# Patient Record
Sex: Female | Born: 1950 | Race: Black or African American | Hispanic: No | Marital: Married | Smoking: Never smoker
Health system: Southern US, Community
[De-identification: ages and names within clinical notes are randomized; demographics above are authoritative.]

## PROBLEM LIST (undated history)

## (undated) DIAGNOSIS — M5430 Sciatica, unspecified side: Secondary | ICD-10-CM

## (undated) DIAGNOSIS — M1990 Unspecified osteoarthritis, unspecified site: Secondary | ICD-10-CM

## (undated) HISTORY — PX: COLONOSCOPY W/ POLYPECTOMY: SHX1380

## (undated) HISTORY — PX: CHOLECYSTECTOMY: SHX55

## (undated) HISTORY — PX: APPENDECTOMY: SHX54

---

## 1998-09-08 ENCOUNTER — Other Ambulatory Visit: Admission: RE | Admit: 1998-09-08 | Discharge: 1998-09-08 | Payer: Self-pay | Admitting: Obstetrics & Gynecology

## 1999-10-24 ENCOUNTER — Other Ambulatory Visit: Admission: RE | Admit: 1999-10-24 | Discharge: 1999-10-24 | Payer: Self-pay | Admitting: Obstetrics & Gynecology

## 2000-10-23 ENCOUNTER — Other Ambulatory Visit: Admission: RE | Admit: 2000-10-23 | Discharge: 2000-10-23 | Payer: Self-pay | Admitting: Obstetrics & Gynecology

## 2001-11-03 ENCOUNTER — Other Ambulatory Visit: Admission: RE | Admit: 2001-11-03 | Discharge: 2001-11-03 | Payer: Self-pay | Admitting: Obstetrics & Gynecology

## 2002-11-05 ENCOUNTER — Other Ambulatory Visit: Admission: RE | Admit: 2002-11-05 | Discharge: 2002-11-05 | Payer: Self-pay | Admitting: Obstetrics & Gynecology

## 2003-11-09 ENCOUNTER — Other Ambulatory Visit: Admission: RE | Admit: 2003-11-09 | Discharge: 2003-11-09 | Payer: Self-pay | Admitting: Obstetrics and Gynecology

## 2003-11-09 ENCOUNTER — Ambulatory Visit (HOSPITAL_COMMUNITY): Admission: RE | Admit: 2003-11-09 | Discharge: 2003-11-09 | Payer: Self-pay | Admitting: Obstetrics and Gynecology

## 2004-11-13 ENCOUNTER — Other Ambulatory Visit: Admission: RE | Admit: 2004-11-13 | Discharge: 2004-11-13 | Payer: Self-pay | Admitting: Obstetrics & Gynecology

## 2005-10-31 ENCOUNTER — Encounter: Admission: RE | Admit: 2005-10-31 | Discharge: 2005-10-31 | Payer: Self-pay | Admitting: Gastroenterology

## 2005-11-14 ENCOUNTER — Inpatient Hospital Stay (HOSPITAL_COMMUNITY): Admission: RE | Admit: 2005-11-14 | Discharge: 2005-11-20 | Payer: Self-pay | Admitting: General Surgery

## 2005-11-14 ENCOUNTER — Encounter (INDEPENDENT_AMBULATORY_CARE_PROVIDER_SITE_OTHER): Payer: Self-pay | Admitting: *Deleted

## 2009-02-18 ENCOUNTER — Encounter: Admission: RE | Admit: 2009-02-18 | Discharge: 2009-02-18 | Payer: Self-pay | Admitting: Family Medicine

## 2010-02-20 ENCOUNTER — Encounter
Admission: RE | Admit: 2010-02-20 | Discharge: 2010-02-20 | Payer: Self-pay | Source: Home / Self Care | Attending: Family Medicine | Admitting: Family Medicine

## 2010-04-24 ENCOUNTER — Other Ambulatory Visit: Payer: Self-pay | Admitting: Obstetrics & Gynecology

## 2010-05-30 ENCOUNTER — Ambulatory Visit: Payer: Federal, State, Local not specified - PPO | Attending: Radiation Oncology | Admitting: Radiation Oncology

## 2010-05-30 DIAGNOSIS — Z510 Encounter for antineoplastic radiation therapy: Secondary | ICD-10-CM | POA: Insufficient documentation

## 2010-05-30 DIAGNOSIS — L910 Hypertrophic scar: Secondary | ICD-10-CM | POA: Insufficient documentation

## 2010-06-09 NOTE — Discharge Summary (Signed)
Gina Huff, Gina Huff           ACCOUNT NO.:  [id number]   MEDICAL RECORD NO.:  [id number]          PATIENT TYPE:  INP   LOCATION:  1608                         FACILITY:  [facility]   PHYSICIAN:  Gita Kudo, M.D. DATE OF BIRTH:  1950-09-05   DATE OF ADMISSION:  11/14/2005  DATE OF DISCHARGE:  11/20/2005                                 DISCHARGE SUMMARY   CHIEF COMPLAINT:  Tumor of her colon, gallstones.   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  A preoperative CT  scan and chest x-ray were obtained and showed gallstones.  She comes in now  after having had a bowel prep for elective surgery.   LABORATORY STUDIES:  Pathology - terminal ileum and right colon with 3.5 cm  tubovillous adenoma.  No high grade dysplasia or malignancy.  Twelve lymph  nodes are negative.  Gallbladder showing cholelithiasis.   Hemoglobin 13, hematocrit 38, white count 5,800, follow up hemoglobin 10,  hematocrit 30, white count 4,200.  CMP totally normal.  She was B+ blood and  none was given.  Urine showed no growth.  EKG was interpreted as normal.  A  preoperative chest x-ray showed no disease in her lungs, and possible  gallstones.   HOSPITAL COURSE:  The patient had undergone an outpatient bowel prep and, on  November 14, 2005, underwent a right colectomy with cholecystectomy.  Postoperatively she did well.  She did have some difficulties with voiding  but this gradually improved.  She was given some antibiotics but had no  positive urine culture.  __________ discharge.  She was allowed home on her  sixth postoperative day.  The wound was healing nicely.  She had no  complications.   She was allowed home on analgesics, continue her routine medications, and  follow up in the office.   DISCHARGE DIAGNOSES:  1. Tubovillous adenoma, cecum.  2. Gallstones.   OPERATIONS:  Right colectomy and cholecystectomy,  November 14, 2005.   COMPLICATIONS:  Infection, consultations - postoperative urinary retention.   CONDITION ON DISCHARGE:  Good.           ______________________________  Gita Kudo, M.D.     MRL/MEDQ  D:  12/11/2005  T:  12/11/2005  Job:  [id number]   cc:   Chales Salmon. Abigail Miyamoto, M.D.  Fax: [phone]   Shirley Friar, MD  Fax: [phone]

## 2010-06-09 NOTE — Op Note (Signed)
Gina Huff, Gina Huff           ACCOUNT NO.:  [id number]   MEDICAL RECORD NO.:  [id number]          PATIENT TYPE:  INP   LOCATION:  0001                         FACILITY:  [facility]   PHYSICIAN:  Gita Kudo, M.D. DATE OF BIRTH:  1950/10/14   DATE OF PROCEDURE:  11/14/2005  DATE OF DISCHARGE:                                 OPERATIVE REPORT   OPERATIVE PROCEDURE:  1. Right colectomy.  2. Cholecystectomy.   SURGEON:  Gita Kudo, M.D.   ASSISTANT:  Ardeth Sportsman, MD   PREOPERATIVE DIAGNOSES:  1. Tubulovillous adenoma, cecum.  2. Gallstones.   POSTOPERATIVE DIAGNOSES:  1. Tubulovillous adenoma, cecum.  2. Gallstones.   CLINICAL SUMMARY:  This 60 year old lady had TVA found on colonoscopic  screening exam because of family history of polyps.  She also had gallstones  noted and has some mild GI symptoms.   OPERATIVE FINDINGS:  A full laparotomy was performed.  The liver felt and  looked normal.  The large and small bowel really felt that looked normal and  there was some thickening at the cecum consistent with the TVA.  I did not  feel her ovaries, but her uterus was enlarged and had definite multiple  fibroids.  The gallbladder had a few small stones, with a normal duct.  The  liver felt and looked normal.   OPERATIVE PROCEDURE:  Under satisfactory general endotracheal anesthesia,  having received a preop bowel prep, IV cefoxitin, Lovenox, she was  positioned, prepped and draped in a standard fashion.  Nasogastric and Foley  catheters were placed.  A right transverse incision was made and the  peritoneal cavity entered and laparotomy performed with findings above.  The  right colon was accomplished by taking down the medial reflections and  mobilizing the colon medially.  The ileum was transected with a GIA stapler  approximately 3 inches proximal to the ileocecal valve.  The hepatic flexure  was mobilized between clamps and ties of silk and the omentum divided  from  the distal right portion of the hepatic flexure.  Then the transverse colon  was divided near the hepatic flexure with the GIA stapler and the mesentery  scored with cautery and taken between clamps and ties of 2-0 silk.  The  specimen was sent for pathology.  A GIA stapled end-to-end/side-to-side  anastomosis performed.  The staple line was checked for hemostasis, which  was good, and the stab wounds closed with a series of interrupted simple and  Gambee silk suture.  Then a crotch suture of the same material, 3-0 silk,  was placed.  Following this, a running 3-0 Vicryl oversewn the silk closure  and continued laterally, approximating the stapled ends of the bowel.  The  mesentery was then closed with interrupted silk figure-of-eight suture.   Gloves were then changed, the abdomen lavaged with saline and the operative  site looked good.  The gallbladder was then removed.   Clamps were placed on the gallbladder and cautery used to take down some  filmy adhesions.  The cystic artery was identified, controlled with multiple  clips and divided.  Then for  safety sake, the gallbladder was removed from  above down until we found the cystic duct and divided it between clips.  The  liver bed had a Alejandro bleeding, which was coagulated, and then a piece of  Surgicel placed on it.   The right gutter was then checked and was hemostatic.  The liver bed looked  fine.  The abdomen was lavaged with saline and suctioned.  Sponge and needle  counts were correct and the abdomen closed in layers with running #1 PDS  suture followed by staples for skin.  Thirty milliliters of 0.5% Marcaine  were infiltrated for postop analgesia.  There were no complications.           ______________________________  Gita Kudo, M.D.     MRL/MEDQ  D:  11/14/2005  T:  11/15/2005  Job:  [id number]   cc:   Chales Salmon. Abigail Miyamoto, M.D.  Fax: [phone]   Shirley Friar, MD  Fax: [phone]

## 2010-07-14 ENCOUNTER — Encounter (HOSPITAL_BASED_OUTPATIENT_CLINIC_OR_DEPARTMENT_OTHER)
Admission: RE | Admit: 2010-07-14 | Discharge: 2010-07-14 | Disposition: A | Discharge status: Home / Self Care | Payer: Federal, State, Local not specified - PPO | Source: Ambulatory Visit | Attending: Specialist | Admitting: Specialist

## 2010-07-14 LAB — CBC
HCT: 33.8 % — ABNORMAL LOW (ref 36.0–46.0)
Hemoglobin: 11.5 g/dL — ABNORMAL LOW (ref 12.0–15.0)
MCH: 29.9 pg (ref 26.0–34.0)
MCHC: 34 g/dL (ref 30.0–36.0)
MCV: 88 fL (ref 78.0–100.0)
Platelets: 234 10*3/uL (ref 150–400)
RBC: 3.84 MIL/uL — ABNORMAL LOW (ref 3.87–5.11)
RDW: 12.8 % (ref 11.5–15.5)
WBC: 4.1 10*3/uL (ref 4.0–10.5)

## 2010-07-14 LAB — BASIC METABOLIC PANEL
BUN: 10 mg/dL (ref 6–23)
CO2: 26 mEq/L (ref 19–32)
Calcium: 9 mg/dL (ref 8.4–10.5)
Chloride: 106 mEq/L (ref 96–112)
Creatinine, Ser: 0.64 mg/dL (ref 0.50–1.10)
GFR calc Af Amer: >60 mL/min (ref >60)
GFR calc non Af Amer: >60 mL/min (ref >60)
Glucose, Bld: 77 mg/dL (ref 70–99)
Potassium: 3.9 mEq/L (ref 3.5–5.1)
Sodium: 139 mEq/L (ref 135–145)

## 2010-07-14 LAB — DIFFERENTIAL
Basophils Absolute: 0 10*3/uL (ref 0.0–0.1)
Basophils Relative: 0 % (ref 0–1)
Eosinophils Absolute: 0 10*3/uL (ref 0.0–0.7)
Eosinophils Relative: 0 % (ref 0–5)
Lymphocytes Relative: 38 % (ref 12–46)
Lymphs Abs: 1.6 10*3/uL (ref 0.7–4.0)
Monocytes Absolute: 0.4 10*3/uL (ref 0.1–1.0)
Monocytes Relative: 10 % (ref 3–12)
Neutro Abs: 2.1 10*3/uL (ref 1.7–7.7)
Neutrophils Relative %: 52 % (ref 43–77)

## 2010-07-17 ENCOUNTER — Ambulatory Visit (HOSPITAL_BASED_OUTPATIENT_CLINIC_OR_DEPARTMENT_OTHER)
Admission: RE | Admit: 2010-07-17 | Discharge: 2010-07-17 | Disposition: A | Discharge status: Home / Self Care | Payer: Federal, State, Local not specified - PPO | Source: Ambulatory Visit | Attending: Specialist | Admitting: Specialist

## 2010-07-17 ENCOUNTER — Other Ambulatory Visit: Payer: Self-pay | Admitting: Specialist

## 2010-07-17 DIAGNOSIS — Z01812 Encounter for preprocedural laboratory examination: Secondary | ICD-10-CM | POA: Insufficient documentation

## 2010-07-17 DIAGNOSIS — L910 Hypertrophic scar: Secondary | ICD-10-CM | POA: Insufficient documentation

## 2010-07-17 DIAGNOSIS — Z01810 Encounter for preprocedural cardiovascular examination: Secondary | ICD-10-CM | POA: Insufficient documentation

## 2010-07-17 LAB — POCT HEMOGLOBIN-HEMACUE: Hemoglobin: 12 g/dL (ref 12.0–15.0)

## 2010-09-26 ENCOUNTER — Ambulatory Visit
Admission: RE | Admit: 2010-09-26 | Discharge: 2010-09-26 | Disposition: A | Discharge status: Home / Self Care | Payer: Federal, State, Local not specified - PPO | Source: Ambulatory Visit | Attending: Radiation Oncology | Admitting: Radiation Oncology

## 2010-11-27 NOTE — Op Note (Signed)
  Gina Huff, Gina Huff           ACCOUNT NO.:  [id number]  MEDICAL RECORD NO.:  [id number]  LOCATION:                                 FACILITY:  PHYSICIAN:  Earvin Hansen L. Shon Hough, M.D.   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  PROCEDURES DONE:  Excision widely of all areas in elliptical fashion, plaster reconstruction.  ANESTHESIA:  General.  Preoperatively, the patient underwent discussion of the procedure.  All procedure in detail was explained the patient who understands consents to surgery.  All potential risks and complications are explained as well as no 100% guarantee of results.  She has seen the radiation oncologist at [facility] who is on the plan on doing low-dose radiation treatments postoperatively.  The patient was taken to the operating room, placed on the operating room table in the supine position, was given adequate general anesthesia, intubated orally.  Prep was done to the chest, breast areas in routine fashion using Hibiclens soap and solution, walled off with sterile towels and drapes so as to make a sterile field.  The area which measured over 8 inches in length and approximately 4 inches in width was drawn in elliptical fashion. Xylocaine 0.5% with epinephrine was injected locally for vasoconstriction a total of 100 mL, 1:200,000 concentration.  After waiting for an appropriate amount of time, the area which was scored with #15 blade and then the Bovie was used on coagulation to dissect under the keloid into the subcutaneous plane.  Hemostasis was maintained with the Bovie on coagulation until we removed the total mass.  We did an aggressive dissection and elevation and dissection undermining of the superior and inferior flaps approximately 3  inches on each side such to bring the wounds together without tension in an advancement flap design, and they were secured deeply with 2-0 Monocryl suture the deep subcutaneous plane and the fascia.  Subcutaneous plane, again subdermal plane with 3-0 Monocryl then a running subcuticular stitch of 3-0 Monocryl.  The wounds were cleansed.  Steri-Strips with soft dressing were applied to all areas.  She withstood the procedure very well, was taken to recovery in good condition after proper dressings have been placed including silicone gel patch, Steri-Strips, ABDs, Hypafix tape.     Yaakov Guthrie. Shon Hough, M.D.     Cathie Hoops  D:  07/17/2010  T:  07/17/2010  Job:  [id number]  Electronically Signed by Louisa Second M.D. on 11/27/2010 07:08:23 PM

## 2011-03-16 ENCOUNTER — Other Ambulatory Visit: Payer: Self-pay | Admitting: Obstetrics & Gynecology

## 2011-03-16 DIAGNOSIS — Z1231 Encounter for screening mammogram for malignant neoplasm of breast: Secondary | ICD-10-CM

## 2011-04-06 ENCOUNTER — Ambulatory Visit
Admission: RE | Admit: 2011-04-06 | Discharge: 2011-04-06 | Disposition: A | Discharge status: Home / Self Care | Payer: Federal, State, Local not specified - PPO | Source: Ambulatory Visit | Attending: Obstetrics & Gynecology | Admitting: Obstetrics & Gynecology

## 2011-04-06 DIAGNOSIS — Z1231 Encounter for screening mammogram for malignant neoplasm of breast: Secondary | ICD-10-CM

## 2012-05-12 ENCOUNTER — Other Ambulatory Visit: Payer: Self-pay | Admitting: Obstetrics & Gynecology

## 2013-05-18 ENCOUNTER — Other Ambulatory Visit: Payer: Self-pay | Admitting: Obstetrics & Gynecology

## 2013-06-02 ENCOUNTER — Other Ambulatory Visit: Payer: Self-pay | Admitting: Obstetrics & Gynecology

## 2013-07-13 ENCOUNTER — Ambulatory Visit: Payer: Federal, State, Local not specified - PPO | Attending: Family Medicine

## 2013-07-13 DIAGNOSIS — M545 Low back pain, unspecified: Secondary | ICD-10-CM | POA: Insufficient documentation

## 2013-07-13 DIAGNOSIS — R5381 Other malaise: Secondary | ICD-10-CM | POA: Insufficient documentation

## 2013-07-13 DIAGNOSIS — M25569 Pain in unspecified knee: Secondary | ICD-10-CM | POA: Insufficient documentation

## 2013-07-13 DIAGNOSIS — IMO0001 Reserved for inherently not codable concepts without codable children: Secondary | ICD-10-CM | POA: Insufficient documentation

## 2013-07-23 ENCOUNTER — Ambulatory Visit: Payer: Federal, State, Local not specified - PPO | Attending: Family Medicine | Admitting: Physical Therapy

## 2013-07-23 DIAGNOSIS — M25569 Pain in unspecified knee: Secondary | ICD-10-CM | POA: Insufficient documentation

## 2013-07-23 DIAGNOSIS — M545 Low back pain, unspecified: Secondary | ICD-10-CM | POA: Insufficient documentation

## 2013-07-23 DIAGNOSIS — IMO0001 Reserved for inherently not codable concepts without codable children: Secondary | ICD-10-CM | POA: Insufficient documentation

## 2013-07-23 DIAGNOSIS — R5381 Other malaise: Secondary | ICD-10-CM | POA: Insufficient documentation

## 2013-07-30 ENCOUNTER — Ambulatory Visit: Payer: Federal, State, Local not specified - PPO | Admitting: Physical Therapy

## 2013-08-06 ENCOUNTER — Encounter: Payer: Federal, State, Local not specified - PPO | Admitting: Physical Therapy

## 2013-08-13 ENCOUNTER — Encounter: Payer: Federal, State, Local not specified - PPO | Admitting: Physical Therapy

## 2013-08-20 ENCOUNTER — Ambulatory Visit: Payer: Federal, State, Local not specified - PPO | Admitting: Physical Therapy

## 2014-04-28 ENCOUNTER — Ambulatory Visit
Admission: RE | Admit: 2014-04-28 | Discharge: 2014-04-28 | Disposition: A | Discharge status: Home / Self Care | Payer: Federal, State, Local not specified - PPO | Source: Ambulatory Visit | Attending: Family Medicine | Admitting: Family Medicine

## 2014-04-28 ENCOUNTER — Other Ambulatory Visit: Payer: Self-pay | Admitting: Family Medicine

## 2014-04-28 DIAGNOSIS — M5416 Radiculopathy, lumbar region: Secondary | ICD-10-CM

## 2014-05-31 ENCOUNTER — Other Ambulatory Visit (HOSPITAL_COMMUNITY): Payer: Self-pay | Admitting: Obstetrics & Gynecology

## 2014-06-01 LAB — CYTOLOGY - PAP

## 2015-05-26 DIAGNOSIS — K080 Exfoliation of teeth due to systemic causes: Secondary | ICD-10-CM | POA: Diagnosis not present

## 2015-06-06 ENCOUNTER — Other Ambulatory Visit: Payer: Self-pay | Admitting: Obstetrics & Gynecology

## 2015-06-06 DIAGNOSIS — Z1231 Encounter for screening mammogram for malignant neoplasm of breast: Secondary | ICD-10-CM | POA: Diagnosis not present

## 2015-06-06 DIAGNOSIS — Z124 Encounter for screening for malignant neoplasm of cervix: Secondary | ICD-10-CM | POA: Diagnosis not present

## 2015-06-06 DIAGNOSIS — Z01419 Encounter for gynecological examination (general) (routine) without abnormal findings: Secondary | ICD-10-CM | POA: Diagnosis not present

## 2015-06-06 DIAGNOSIS — Z6826 Body mass index (BMI) 26.0-26.9, adult: Secondary | ICD-10-CM | POA: Diagnosis not present

## 2015-06-07 LAB — CYTOLOGY - PAP

## 2015-06-09 DIAGNOSIS — H53002 Unspecified amblyopia, left eye: Secondary | ICD-10-CM | POA: Diagnosis not present

## 2015-06-09 DIAGNOSIS — H25042 Posterior subcapsular polar age-related cataract, left eye: Secondary | ICD-10-CM | POA: Diagnosis not present

## 2015-06-09 DIAGNOSIS — H5211 Myopia, right eye: Secondary | ICD-10-CM | POA: Diagnosis not present

## 2015-06-09 DIAGNOSIS — H25013 Cortical age-related cataract, bilateral: Secondary | ICD-10-CM | POA: Diagnosis not present

## 2015-07-11 DIAGNOSIS — Z0000 Encounter for general adult medical examination without abnormal findings: Secondary | ICD-10-CM | POA: Diagnosis not present

## 2015-07-11 DIAGNOSIS — E559 Vitamin D deficiency, unspecified: Secondary | ICD-10-CM | POA: Diagnosis not present

## 2015-07-11 DIAGNOSIS — Z209 Contact with and (suspected) exposure to unspecified communicable disease: Secondary | ICD-10-CM | POA: Diagnosis not present

## 2015-07-11 DIAGNOSIS — Z13220 Encounter for screening for lipoid disorders: Secondary | ICD-10-CM | POA: Diagnosis not present

## 2015-07-11 DIAGNOSIS — Z130 Encounter for screening for diseases of the blood and blood-forming organs and certain disorders involving the immune mechanism: Secondary | ICD-10-CM | POA: Diagnosis not present

## 2015-07-25 DIAGNOSIS — E2839 Other primary ovarian failure: Secondary | ICD-10-CM | POA: Diagnosis not present

## 2015-09-27 ENCOUNTER — Other Ambulatory Visit: Payer: Self-pay | Admitting: Obstetrics & Gynecology

## 2015-09-27 DIAGNOSIS — Z124 Encounter for screening for malignant neoplasm of cervix: Secondary | ICD-10-CM | POA: Diagnosis not present

## 2015-09-27 DIAGNOSIS — Z6826 Body mass index (BMI) 26.0-26.9, adult: Secondary | ICD-10-CM | POA: Diagnosis not present

## 2015-09-27 DIAGNOSIS — R87610 Atypical squamous cells of undetermined significance on cytologic smear of cervix (ASC-US): Secondary | ICD-10-CM | POA: Diagnosis not present

## 2015-09-28 LAB — CYTOLOGY - PAP

## 2015-11-28 DIAGNOSIS — K080 Exfoliation of teeth due to systemic causes: Secondary | ICD-10-CM | POA: Diagnosis not present

## 2015-12-22 DIAGNOSIS — J020 Streptococcal pharyngitis: Secondary | ICD-10-CM | POA: Diagnosis not present

## 2015-12-22 DIAGNOSIS — J029 Acute pharyngitis, unspecified: Secondary | ICD-10-CM | POA: Diagnosis not present

## 2016-03-23 DIAGNOSIS — L700 Acne vulgaris: Secondary | ICD-10-CM | POA: Diagnosis not present

## 2016-03-31 DIAGNOSIS — J101 Influenza due to other identified influenza virus with other respiratory manifestations: Secondary | ICD-10-CM | POA: Diagnosis not present

## 2016-03-31 DIAGNOSIS — J069 Acute upper respiratory infection, unspecified: Secondary | ICD-10-CM | POA: Diagnosis not present

## 2016-05-31 DIAGNOSIS — K080 Exfoliation of teeth due to systemic causes: Secondary | ICD-10-CM | POA: Diagnosis not present

## 2016-06-08 DIAGNOSIS — H25013 Cortical age-related cataract, bilateral: Secondary | ICD-10-CM | POA: Diagnosis not present

## 2016-06-08 DIAGNOSIS — H524 Presbyopia: Secondary | ICD-10-CM | POA: Diagnosis not present

## 2016-06-08 DIAGNOSIS — H5010 Unspecified exotropia: Secondary | ICD-10-CM | POA: Diagnosis not present

## 2016-06-08 DIAGNOSIS — H2513 Age-related nuclear cataract, bilateral: Secondary | ICD-10-CM | POA: Diagnosis not present

## 2016-07-11 DIAGNOSIS — Z13220 Encounter for screening for lipoid disorders: Secondary | ICD-10-CM | POA: Diagnosis not present

## 2016-07-11 DIAGNOSIS — E559 Vitamin D deficiency, unspecified: Secondary | ICD-10-CM | POA: Diagnosis not present

## 2016-07-11 DIAGNOSIS — Z130 Encounter for screening for diseases of the blood and blood-forming organs and certain disorders involving the immune mechanism: Secondary | ICD-10-CM | POA: Diagnosis not present

## 2016-07-11 DIAGNOSIS — Z0000 Encounter for general adult medical examination without abnormal findings: Secondary | ICD-10-CM | POA: Diagnosis not present

## 2016-07-19 DIAGNOSIS — H25812 Combined forms of age-related cataract, left eye: Secondary | ICD-10-CM | POA: Diagnosis not present

## 2016-07-19 DIAGNOSIS — H2512 Age-related nuclear cataract, left eye: Secondary | ICD-10-CM | POA: Diagnosis not present

## 2016-07-19 DIAGNOSIS — H25012 Cortical age-related cataract, left eye: Secondary | ICD-10-CM | POA: Diagnosis not present

## 2016-08-14 DIAGNOSIS — Z1231 Encounter for screening mammogram for malignant neoplasm of breast: Secondary | ICD-10-CM | POA: Diagnosis not present

## 2016-08-14 DIAGNOSIS — Z01419 Encounter for gynecological examination (general) (routine) without abnormal findings: Secondary | ICD-10-CM | POA: Diagnosis not present

## 2016-08-14 DIAGNOSIS — R87611 Atypical squamous cells cannot exclude high grade squamous intraepithelial lesion on cytologic smear of cervix (ASC-H): Secondary | ICD-10-CM | POA: Diagnosis not present

## 2016-08-14 DIAGNOSIS — Z124 Encounter for screening for malignant neoplasm of cervix: Secondary | ICD-10-CM | POA: Diagnosis not present

## 2016-08-14 DIAGNOSIS — Z6826 Body mass index (BMI) 26.0-26.9, adult: Secondary | ICD-10-CM | POA: Diagnosis not present

## 2016-09-10 DIAGNOSIS — Z6826 Body mass index (BMI) 26.0-26.9, adult: Secondary | ICD-10-CM | POA: Diagnosis not present

## 2016-09-10 DIAGNOSIS — R87610 Atypical squamous cells of undetermined significance on cytologic smear of cervix (ASC-US): Secondary | ICD-10-CM | POA: Diagnosis not present

## 2016-10-26 DIAGNOSIS — Z23 Encounter for immunization: Secondary | ICD-10-CM | POA: Diagnosis not present

## 2016-12-05 DIAGNOSIS — K080 Exfoliation of teeth due to systemic causes: Secondary | ICD-10-CM | POA: Diagnosis not present

## 2017-02-18 DIAGNOSIS — K080 Exfoliation of teeth due to systemic causes: Secondary | ICD-10-CM | POA: Diagnosis not present

## 2017-02-22 DIAGNOSIS — M5416 Radiculopathy, lumbar region: Secondary | ICD-10-CM | POA: Diagnosis not present

## 2017-02-22 DIAGNOSIS — M5386 Other specified dorsopathies, lumbar region: Secondary | ICD-10-CM | POA: Diagnosis not present

## 2017-03-11 DIAGNOSIS — M5416 Radiculopathy, lumbar region: Secondary | ICD-10-CM | POA: Diagnosis not present

## 2017-03-11 DIAGNOSIS — M4310 Spondylolisthesis, site unspecified: Secondary | ICD-10-CM | POA: Diagnosis not present

## 2017-03-19 DIAGNOSIS — M4316 Spondylolisthesis, lumbar region: Secondary | ICD-10-CM | POA: Diagnosis not present

## 2017-03-19 DIAGNOSIS — M5416 Radiculopathy, lumbar region: Secondary | ICD-10-CM | POA: Diagnosis not present

## 2017-03-19 DIAGNOSIS — M5136 Other intervertebral disc degeneration, lumbar region: Secondary | ICD-10-CM | POA: Diagnosis not present

## 2017-03-22 DIAGNOSIS — M5416 Radiculopathy, lumbar region: Secondary | ICD-10-CM | POA: Insufficient documentation

## 2017-03-27 DIAGNOSIS — M5416 Radiculopathy, lumbar region: Secondary | ICD-10-CM | POA: Diagnosis not present

## 2017-04-03 DIAGNOSIS — M5416 Radiculopathy, lumbar region: Secondary | ICD-10-CM | POA: Diagnosis not present

## 2017-04-12 DIAGNOSIS — M5416 Radiculopathy, lumbar region: Secondary | ICD-10-CM | POA: Diagnosis not present

## 2017-04-19 DIAGNOSIS — M5416 Radiculopathy, lumbar region: Secondary | ICD-10-CM | POA: Diagnosis not present

## 2017-04-19 DIAGNOSIS — M5136 Other intervertebral disc degeneration, lumbar region: Secondary | ICD-10-CM | POA: Diagnosis not present

## 2017-04-19 DIAGNOSIS — M4316 Spondylolisthesis, lumbar region: Secondary | ICD-10-CM | POA: Diagnosis not present

## 2017-04-19 DIAGNOSIS — M5126 Other intervertebral disc displacement, lumbar region: Secondary | ICD-10-CM | POA: Diagnosis not present

## 2017-04-22 DIAGNOSIS — M4310 Spondylolisthesis, site unspecified: Secondary | ICD-10-CM | POA: Diagnosis not present

## 2017-04-22 DIAGNOSIS — M5416 Radiculopathy, lumbar region: Secondary | ICD-10-CM | POA: Diagnosis not present

## 2017-05-13 DIAGNOSIS — K080 Exfoliation of teeth due to systemic causes: Secondary | ICD-10-CM | POA: Diagnosis not present

## 2017-06-10 DIAGNOSIS — K080 Exfoliation of teeth due to systemic causes: Secondary | ICD-10-CM | POA: Diagnosis not present

## 2017-07-05 DIAGNOSIS — M4310 Spondylolisthesis, site unspecified: Secondary | ICD-10-CM | POA: Diagnosis not present

## 2017-07-05 DIAGNOSIS — Z79899 Other long term (current) drug therapy: Secondary | ICD-10-CM | POA: Diagnosis not present

## 2017-07-05 DIAGNOSIS — E559 Vitamin D deficiency, unspecified: Secondary | ICD-10-CM | POA: Diagnosis not present

## 2017-07-05 DIAGNOSIS — M5416 Radiculopathy, lumbar region: Secondary | ICD-10-CM | POA: Diagnosis not present

## 2017-07-05 DIAGNOSIS — R030 Elevated blood-pressure reading, without diagnosis of hypertension: Secondary | ICD-10-CM | POA: Diagnosis not present

## 2017-07-12 DIAGNOSIS — D2272 Melanocytic nevi of left lower limb, including hip: Secondary | ICD-10-CM | POA: Diagnosis not present

## 2017-07-12 DIAGNOSIS — D225 Melanocytic nevi of trunk: Secondary | ICD-10-CM | POA: Diagnosis not present

## 2017-07-12 DIAGNOSIS — L700 Acne vulgaris: Secondary | ICD-10-CM | POA: Diagnosis not present

## 2017-08-26 DIAGNOSIS — K080 Exfoliation of teeth due to systemic causes: Secondary | ICD-10-CM | POA: Diagnosis not present

## 2017-09-03 DIAGNOSIS — Z1231 Encounter for screening mammogram for malignant neoplasm of breast: Secondary | ICD-10-CM | POA: Diagnosis not present

## 2017-09-03 DIAGNOSIS — Z6827 Body mass index (BMI) 27.0-27.9, adult: Secondary | ICD-10-CM | POA: Diagnosis not present

## 2017-09-03 DIAGNOSIS — Z124 Encounter for screening for malignant neoplasm of cervix: Secondary | ICD-10-CM | POA: Diagnosis not present

## 2017-09-03 DIAGNOSIS — Z01419 Encounter for gynecological examination (general) (routine) without abnormal findings: Secondary | ICD-10-CM | POA: Diagnosis not present

## 2017-09-17 DIAGNOSIS — H53002 Unspecified amblyopia, left eye: Secondary | ICD-10-CM | POA: Diagnosis not present

## 2017-09-17 DIAGNOSIS — H25041 Posterior subcapsular polar age-related cataract, right eye: Secondary | ICD-10-CM | POA: Diagnosis not present

## 2017-09-17 DIAGNOSIS — H5211 Myopia, right eye: Secondary | ICD-10-CM | POA: Diagnosis not present

## 2017-09-17 DIAGNOSIS — H5010 Unspecified exotropia: Secondary | ICD-10-CM | POA: Diagnosis not present

## 2017-10-01 DIAGNOSIS — Z79899 Other long term (current) drug therapy: Secondary | ICD-10-CM | POA: Diagnosis not present

## 2017-10-01 DIAGNOSIS — Z130 Encounter for screening for diseases of the blood and blood-forming organs and certain disorders involving the immune mechanism: Secondary | ICD-10-CM | POA: Diagnosis not present

## 2017-10-01 DIAGNOSIS — Z23 Encounter for immunization: Secondary | ICD-10-CM | POA: Diagnosis not present

## 2017-10-01 DIAGNOSIS — Z136 Encounter for screening for cardiovascular disorders: Secondary | ICD-10-CM | POA: Diagnosis not present

## 2017-10-01 DIAGNOSIS — E559 Vitamin D deficiency, unspecified: Secondary | ICD-10-CM | POA: Diagnosis not present

## 2017-10-01 DIAGNOSIS — Z0001 Encounter for general adult medical examination with abnormal findings: Secondary | ICD-10-CM | POA: Diagnosis not present

## 2017-10-04 ENCOUNTER — Other Ambulatory Visit: Payer: Self-pay | Admitting: Family Medicine

## 2017-10-04 DIAGNOSIS — M5416 Radiculopathy, lumbar region: Secondary | ICD-10-CM

## 2017-10-16 ENCOUNTER — Ambulatory Visit
Admission: RE | Admit: 2017-10-16 | Discharge: 2017-10-16 | Disposition: A | Discharge status: Home / Self Care | Payer: Federal, State, Local not specified - PPO | Source: Ambulatory Visit | Attending: Family Medicine | Admitting: Family Medicine

## 2017-10-16 DIAGNOSIS — M48061 Spinal stenosis, lumbar region without neurogenic claudication: Secondary | ICD-10-CM | POA: Diagnosis not present

## 2017-10-16 DIAGNOSIS — M5416 Radiculopathy, lumbar region: Secondary | ICD-10-CM

## 2017-10-31 DIAGNOSIS — Z23 Encounter for immunization: Secondary | ICD-10-CM | POA: Diagnosis not present

## 2017-11-05 DIAGNOSIS — M48061 Spinal stenosis, lumbar region without neurogenic claudication: Secondary | ICD-10-CM | POA: Insufficient documentation

## 2017-11-05 DIAGNOSIS — M5416 Radiculopathy, lumbar region: Secondary | ICD-10-CM | POA: Diagnosis not present

## 2017-11-25 DIAGNOSIS — K080 Exfoliation of teeth due to systemic causes: Secondary | ICD-10-CM | POA: Diagnosis not present

## 2017-12-13 DIAGNOSIS — K080 Exfoliation of teeth due to systemic causes: Secondary | ICD-10-CM | POA: Diagnosis not present

## 2018-01-07 DIAGNOSIS — L709 Acne, unspecified: Secondary | ICD-10-CM | POA: Diagnosis not present

## 2018-01-07 DIAGNOSIS — M5416 Radiculopathy, lumbar region: Secondary | ICD-10-CM | POA: Diagnosis not present

## 2018-01-07 DIAGNOSIS — Z79899 Other long term (current) drug therapy: Secondary | ICD-10-CM | POA: Diagnosis not present

## 2018-02-07 DIAGNOSIS — K080 Exfoliation of teeth due to systemic causes: Secondary | ICD-10-CM | POA: Diagnosis not present

## 2018-08-07 DIAGNOSIS — L700 Acne vulgaris: Secondary | ICD-10-CM | POA: Diagnosis not present

## 2018-10-09 DIAGNOSIS — Z23 Encounter for immunization: Secondary | ICD-10-CM | POA: Diagnosis not present

## 2018-10-13 DIAGNOSIS — Z124 Encounter for screening for malignant neoplasm of cervix: Secondary | ICD-10-CM | POA: Diagnosis not present

## 2018-10-13 DIAGNOSIS — Z01419 Encounter for gynecological examination (general) (routine) without abnormal findings: Secondary | ICD-10-CM | POA: Diagnosis not present

## 2018-10-13 DIAGNOSIS — Z6827 Body mass index (BMI) 27.0-27.9, adult: Secondary | ICD-10-CM | POA: Diagnosis not present

## 2018-11-07 DIAGNOSIS — Z1231 Encounter for screening mammogram for malignant neoplasm of breast: Secondary | ICD-10-CM | POA: Diagnosis not present

## 2018-11-14 DIAGNOSIS — H53002 Unspecified amblyopia, left eye: Secondary | ICD-10-CM | POA: Diagnosis not present

## 2018-11-14 DIAGNOSIS — H5211 Myopia, right eye: Secondary | ICD-10-CM | POA: Diagnosis not present

## 2018-11-14 DIAGNOSIS — H25011 Cortical age-related cataract, right eye: Secondary | ICD-10-CM | POA: Diagnosis not present

## 2018-11-14 DIAGNOSIS — H2511 Age-related nuclear cataract, right eye: Secondary | ICD-10-CM | POA: Diagnosis not present

## 2018-12-02 DIAGNOSIS — Z13220 Encounter for screening for lipoid disorders: Secondary | ICD-10-CM | POA: Diagnosis not present

## 2018-12-02 DIAGNOSIS — Z0001 Encounter for general adult medical examination with abnormal findings: Secondary | ICD-10-CM | POA: Diagnosis not present

## 2018-12-02 DIAGNOSIS — Z130 Encounter for screening for diseases of the blood and blood-forming organs and certain disorders involving the immune mechanism: Secondary | ICD-10-CM | POA: Diagnosis not present

## 2018-12-02 DIAGNOSIS — Z79899 Other long term (current) drug therapy: Secondary | ICD-10-CM | POA: Diagnosis not present

## 2018-12-02 DIAGNOSIS — E559 Vitamin D deficiency, unspecified: Secondary | ICD-10-CM | POA: Diagnosis not present

## 2019-01-12 DIAGNOSIS — Z7189 Other specified counseling: Secondary | ICD-10-CM | POA: Diagnosis not present

## 2019-01-12 DIAGNOSIS — Z20828 Contact with and (suspected) exposure to other viral communicable diseases: Secondary | ICD-10-CM | POA: Diagnosis not present

## 2019-01-24 DIAGNOSIS — J029 Acute pharyngitis, unspecified: Secondary | ICD-10-CM | POA: Diagnosis not present

## 2019-01-24 DIAGNOSIS — R05 Cough: Secondary | ICD-10-CM | POA: Diagnosis not present

## 2019-01-24 DIAGNOSIS — Z20828 Contact with and (suspected) exposure to other viral communicable diseases: Secondary | ICD-10-CM | POA: Diagnosis not present

## 2019-01-24 DIAGNOSIS — B349 Viral infection, unspecified: Secondary | ICD-10-CM | POA: Diagnosis not present

## 2019-01-26 ENCOUNTER — Ambulatory Visit: Payer: Federal, State, Local not specified - PPO | Attending: Internal Medicine

## 2019-01-26 DIAGNOSIS — Z20822 Contact with and (suspected) exposure to covid-19: Secondary | ICD-10-CM

## 2019-01-28 LAB — NOVEL CORONAVIRUS, NAA: SARS-CoV-2, NAA: DETECTED — AB

## 2019-03-07 ENCOUNTER — Ambulatory Visit: Payer: Federal, State, Local not specified - PPO

## 2019-03-19 ENCOUNTER — Ambulatory Visit: Payer: Federal, State, Local not specified - PPO | Attending: Internal Medicine

## 2019-03-19 DIAGNOSIS — Z23 Encounter for immunization: Secondary | ICD-10-CM

## 2019-03-19 NOTE — Progress Notes (Signed)
   Covid-19 Vaccination Clinic  Name:  Gina Huff    MRN: [id number] DOB: 1950/07/22  03/19/2019  [name] was observed post Covid-19 immunization for 15 minutes without incidence. She was provided with Vaccine Information Sheet and instruction to access the V-Safe system.   [name] was instructed to call 911 with any severe reactions post vaccine: Marland Kitchen Difficulty breathing  . Swelling of your face and throat  . A fast heartbeat  . A bad rash all over your body  . Dizziness and weakness    Immunizations Administered    Name Date Dose VIS Date Route   Pfizer COVID-19 Vaccine 03/19/2019 12:23 PM 0.3 mL 01/02/2019 Intramuscular   Manufacturer: [place]   Lot: [id number]   [place]: [id number]

## 2019-04-14 ENCOUNTER — Ambulatory Visit: Payer: Federal, State, Local not specified - PPO | Attending: Internal Medicine

## 2019-04-14 DIAGNOSIS — Z23 Encounter for immunization: Secondary | ICD-10-CM

## 2019-04-14 NOTE — Progress Notes (Signed)
   Covid-19 Vaccination Clinic  Name:  Gina Huff    MRN: [id number] DOB: [birth year]  04/14/2019  Gina Huff was observed post Covid-19 immunization for 15 minutes without incident. She was provided with Vaccine Information Sheet and instruction to access the V-Safe system.   Gina Huff was instructed to call 911 with any severe reactions post vaccine: Marland Kitchen Difficulty breathing  . Swelling of face and throat  . A fast heartbeat  . A bad rash all over body  . Dizziness and weakness   Immunizations Administered    Name Date Dose VIS Date Route   Pfizer COVID-19 Vaccine 04/14/2019 10:19 AM 0.3 mL 01/02/2019 Intramuscular   Manufacturer: [place]   Lot: [id number]   [place]: [id number]

## 2020-07-15 ENCOUNTER — Other Ambulatory Visit: Payer: Self-pay | Admitting: Family Medicine

## 2020-07-15 DIAGNOSIS — N6452 Nipple discharge: Secondary | ICD-10-CM

## 2020-08-31 ENCOUNTER — Other Ambulatory Visit: Payer: Self-pay

## 2020-08-31 ENCOUNTER — Ambulatory Visit
Admission: RE | Admit: 2020-08-31 | Discharge: 2020-08-31 | Disposition: A | Discharge status: Home / Self Care | Payer: Medicare Other | Source: Ambulatory Visit | Attending: Family Medicine | Admitting: Family Medicine

## 2020-08-31 DIAGNOSIS — N6452 Nipple discharge: Secondary | ICD-10-CM

## 2020-10-25 ENCOUNTER — Other Ambulatory Visit: Payer: Self-pay | Admitting: Family Medicine

## 2020-10-25 DIAGNOSIS — N6452 Nipple discharge: Secondary | ICD-10-CM

## 2020-11-08 ENCOUNTER — Other Ambulatory Visit: Payer: Medicare Other

## 2020-11-13 ENCOUNTER — Other Ambulatory Visit: Payer: Self-pay

## 2020-11-13 ENCOUNTER — Ambulatory Visit
Admission: RE | Admit: 2020-11-13 | Discharge: 2020-11-13 | Disposition: A | Discharge status: Home / Self Care | Payer: Medicare Other | Source: Ambulatory Visit | Attending: Family Medicine | Admitting: Family Medicine

## 2020-11-13 DIAGNOSIS — N6452 Nipple discharge: Secondary | ICD-10-CM

## 2020-11-13 MED ORDER — GADOBUTROL 1 MMOL/ML IV SOLN
7.0000 mL | Freq: Once | INTRAVENOUS | Status: AC | PRN
  Administered 2020-11-13: 7 mL via INTRAVENOUS

## 2020-12-05 DIAGNOSIS — E559 Vitamin D deficiency, unspecified: Secondary | ICD-10-CM | POA: Diagnosis not present

## 2020-12-05 DIAGNOSIS — Z130 Encounter for screening for diseases of the blood and blood-forming organs and certain disorders involving the immune mechanism: Secondary | ICD-10-CM | POA: Diagnosis not present

## 2020-12-05 DIAGNOSIS — Z136 Encounter for screening for cardiovascular disorders: Secondary | ICD-10-CM | POA: Diagnosis not present

## 2021-08-14 DIAGNOSIS — L700 Acne vulgaris: Secondary | ICD-10-CM | POA: Diagnosis not present

## 2021-11-24 ENCOUNTER — Other Ambulatory Visit: Payer: Self-pay | Admitting: Family Medicine

## 2021-11-24 DIAGNOSIS — Z1231 Encounter for screening mammogram for malignant neoplasm of breast: Secondary | ICD-10-CM

## 2021-12-04 DIAGNOSIS — D259 Leiomyoma of uterus, unspecified: Secondary | ICD-10-CM | POA: Diagnosis not present

## 2021-12-04 DIAGNOSIS — N643 Galactorrhea not associated with childbirth: Secondary | ICD-10-CM | POA: Diagnosis not present

## 2021-12-04 DIAGNOSIS — N959 Unspecified menopausal and perimenopausal disorder: Secondary | ICD-10-CM | POA: Diagnosis not present

## 2021-12-07 DIAGNOSIS — Z6826 Body mass index (BMI) 26.0-26.9, adult: Secondary | ICD-10-CM | POA: Diagnosis not present

## 2021-12-07 DIAGNOSIS — M5430 Sciatica, unspecified side: Secondary | ICD-10-CM | POA: Diagnosis not present

## 2021-12-07 DIAGNOSIS — H6122 Impacted cerumen, left ear: Secondary | ICD-10-CM | POA: Diagnosis not present

## 2021-12-07 DIAGNOSIS — E559 Vitamin D deficiency, unspecified: Secondary | ICD-10-CM | POA: Diagnosis not present

## 2021-12-07 DIAGNOSIS — Z131 Encounter for screening for diabetes mellitus: Secondary | ICD-10-CM | POA: Diagnosis not present

## 2021-12-07 DIAGNOSIS — E2839 Other primary ovarian failure: Secondary | ICD-10-CM | POA: Diagnosis not present

## 2021-12-08 ENCOUNTER — Other Ambulatory Visit: Payer: Self-pay | Admitting: Family Medicine

## 2021-12-08 DIAGNOSIS — Z1389 Encounter for screening for other disorder: Secondary | ICD-10-CM | POA: Diagnosis not present

## 2021-12-08 DIAGNOSIS — Z0000 Encounter for general adult medical examination without abnormal findings: Secondary | ICD-10-CM | POA: Diagnosis not present

## 2021-12-08 DIAGNOSIS — E2839 Other primary ovarian failure: Secondary | ICD-10-CM

## 2021-12-08 DIAGNOSIS — Z6826 Body mass index (BMI) 26.0-26.9, adult: Secondary | ICD-10-CM | POA: Diagnosis not present

## 2021-12-11 DIAGNOSIS — Z961 Presence of intraocular lens: Secondary | ICD-10-CM | POA: Diagnosis not present

## 2021-12-11 DIAGNOSIS — H2511 Age-related nuclear cataract, right eye: Secondary | ICD-10-CM | POA: Diagnosis not present

## 2021-12-11 DIAGNOSIS — H5211 Myopia, right eye: Secondary | ICD-10-CM | POA: Diagnosis not present

## 2021-12-11 DIAGNOSIS — H53002 Unspecified amblyopia, left eye: Secondary | ICD-10-CM | POA: Diagnosis not present

## 2021-12-13 ENCOUNTER — Ambulatory Visit
Admission: RE | Admit: 2021-12-13 | Discharge: 2021-12-13 | Disposition: A | Discharge status: Home / Self Care | Payer: Medicare Other | Source: Ambulatory Visit | Attending: Family Medicine | Admitting: Family Medicine

## 2021-12-13 DIAGNOSIS — Z780 Asymptomatic menopausal state: Secondary | ICD-10-CM | POA: Diagnosis not present

## 2021-12-13 DIAGNOSIS — E2839 Other primary ovarian failure: Secondary | ICD-10-CM

## 2021-12-19 DIAGNOSIS — K080 Exfoliation of teeth due to systemic causes: Secondary | ICD-10-CM | POA: Diagnosis not present

## 2022-01-24 ENCOUNTER — Ambulatory Visit
Admission: RE | Admit: 2022-01-24 | Discharge: 2022-01-24 | Disposition: A | Discharge status: Home / Self Care | Payer: Medicare Other | Source: Ambulatory Visit | Attending: Family Medicine | Admitting: Family Medicine

## 2022-01-24 DIAGNOSIS — Z1231 Encounter for screening mammogram for malignant neoplasm of breast: Secondary | ICD-10-CM

## 2022-01-25 ENCOUNTER — Other Ambulatory Visit: Payer: Self-pay | Admitting: Family Medicine

## 2022-01-25 DIAGNOSIS — R928 Other abnormal and inconclusive findings on diagnostic imaging of breast: Secondary | ICD-10-CM

## 2022-02-07 ENCOUNTER — Other Ambulatory Visit: Payer: Self-pay | Admitting: Family Medicine

## 2022-02-07 ENCOUNTER — Other Ambulatory Visit: Payer: Medicare Other

## 2022-02-07 DIAGNOSIS — R928 Other abnormal and inconclusive findings on diagnostic imaging of breast: Secondary | ICD-10-CM

## 2022-02-13 DIAGNOSIS — M542 Cervicalgia: Secondary | ICD-10-CM | POA: Diagnosis not present

## 2022-02-13 DIAGNOSIS — M5451 Vertebrogenic low back pain: Secondary | ICD-10-CM | POA: Diagnosis not present

## 2022-02-14 ENCOUNTER — Ambulatory Visit
Admission: RE | Admit: 2022-02-14 | Discharge: 2022-02-14 | Disposition: A | Discharge status: Home / Self Care | Payer: Medicare Other | Source: Ambulatory Visit | Attending: Family Medicine | Admitting: Family Medicine

## 2022-02-14 ENCOUNTER — Other Ambulatory Visit: Payer: Self-pay | Admitting: Family Medicine

## 2022-02-14 DIAGNOSIS — N6320 Unspecified lump in the left breast, unspecified quadrant: Secondary | ICD-10-CM

## 2022-02-14 DIAGNOSIS — R928 Other abnormal and inconclusive findings on diagnostic imaging of breast: Secondary | ICD-10-CM | POA: Diagnosis not present

## 2022-02-14 DIAGNOSIS — N6323 Unspecified lump in the left breast, lower outer quadrant: Secondary | ICD-10-CM | POA: Diagnosis not present

## 2022-02-19 ENCOUNTER — Ambulatory Visit
Admission: RE | Admit: 2022-02-19 | Discharge: 2022-02-19 | Disposition: A | Discharge status: Home / Self Care | Payer: Medicare Other | Source: Ambulatory Visit | Attending: Family Medicine | Admitting: Family Medicine

## 2022-02-19 ENCOUNTER — Other Ambulatory Visit: Payer: Self-pay | Admitting: Family Medicine

## 2022-02-19 DIAGNOSIS — N6320 Unspecified lump in the left breast, unspecified quadrant: Secondary | ICD-10-CM

## 2022-02-19 DIAGNOSIS — N6012 Diffuse cystic mastopathy of left breast: Secondary | ICD-10-CM | POA: Diagnosis not present

## 2022-02-19 DIAGNOSIS — D242 Benign neoplasm of left breast: Secondary | ICD-10-CM | POA: Diagnosis not present

## 2022-02-19 DIAGNOSIS — N6323 Unspecified lump in the left breast, lower outer quadrant: Secondary | ICD-10-CM | POA: Diagnosis not present

## 2022-02-19 HISTORY — PX: BREAST BIOPSY: SHX20

## 2022-02-28 DIAGNOSIS — M5451 Vertebrogenic low back pain: Secondary | ICD-10-CM | POA: Diagnosis not present

## 2022-03-05 ENCOUNTER — Ambulatory Visit: Payer: Self-pay | Admitting: Surgery

## 2022-03-05 DIAGNOSIS — D242 Benign neoplasm of left breast: Secondary | ICD-10-CM | POA: Diagnosis not present

## 2022-03-05 DIAGNOSIS — N6452 Nipple discharge: Secondary | ICD-10-CM | POA: Diagnosis not present

## 2022-03-07 ENCOUNTER — Ambulatory Visit: Payer: Self-pay | Admitting: Surgery

## 2022-03-07 DIAGNOSIS — D369 Benign neoplasm, unspecified site: Secondary | ICD-10-CM

## 2022-03-08 ENCOUNTER — Other Ambulatory Visit: Payer: Self-pay | Admitting: Surgery

## 2022-03-08 DIAGNOSIS — D369 Benign neoplasm, unspecified site: Secondary | ICD-10-CM

## 2022-03-08 DIAGNOSIS — M5451 Vertebrogenic low back pain: Secondary | ICD-10-CM | POA: Diagnosis not present

## 2022-03-14 DIAGNOSIS — M5451 Vertebrogenic low back pain: Secondary | ICD-10-CM | POA: Diagnosis not present

## 2022-03-20 DIAGNOSIS — M5451 Vertebrogenic low back pain: Secondary | ICD-10-CM | POA: Diagnosis not present

## 2022-03-22 DIAGNOSIS — M5451 Vertebrogenic low back pain: Secondary | ICD-10-CM | POA: Diagnosis not present

## 2022-03-28 DIAGNOSIS — M5451 Vertebrogenic low back pain: Secondary | ICD-10-CM | POA: Diagnosis not present

## 2022-03-30 ENCOUNTER — Other Ambulatory Visit: Payer: Self-pay

## 2022-03-30 ENCOUNTER — Encounter (HOSPITAL_BASED_OUTPATIENT_CLINIC_OR_DEPARTMENT_OTHER): Payer: Self-pay | Admitting: Surgery

## 2022-04-04 ENCOUNTER — Ambulatory Visit
Admission: RE | Admit: 2022-04-04 | Discharge: 2022-04-04 | Disposition: A | Discharge status: Home / Self Care | Payer: Medicare Other | Source: Ambulatory Visit | Attending: Surgery | Admitting: Surgery

## 2022-04-04 DIAGNOSIS — R928 Other abnormal and inconclusive findings on diagnostic imaging of breast: Secondary | ICD-10-CM | POA: Diagnosis not present

## 2022-04-04 HISTORY — PX: BREAST BIOPSY: SHX20

## 2022-04-04 NOTE — Progress Notes (Signed)
      Enhanced Recovery after Surgery for Orthopedics Enhanced Recovery after Surgery is a protocol used to improve the stress on your body and your recovery after surgery.  Patient Instructions  The night before surgery:  No food after midnight. ONLY clear liquids after midnight  The day of surgery (if you do NOT have diabetes):  Drink ONE (1) Pre-Surgery Clear Ensure as directed.   This drink was given to you during your hospital  pre-op appointment visit. The pre-op nurse will instruct you on the time to drink the  Pre-Surgery Ensure depending on your surgery time. Finish the drink at the designated time by the pre-op nurse.  Nothing else to drink after completing the  Pre-Surgery Clear Ensure.           If you have questions, please contact your surgeon's office.

## 2022-04-04 NOTE — Anesthesia Preprocedure Evaluation (Signed)
Anesthesia Evaluation  Patient identified by MRN, date of birth, ID band Patient awake    Reviewed: Allergy & Precautions, NPO status , Patient's Chart, lab work & pertinent test results  Airway Mallampati: I  TM Distance: >3 FB Neck ROM: Full    Dental no notable dental hx.    Pulmonary neg pulmonary ROS   Pulmonary exam normal        Cardiovascular negative cardio ROS Normal cardiovascular exam     Neuro/Psych negative neurological ROS  negative psych ROS   GI/Hepatic negative GI ROS, Neg liver ROS,,,  Endo/Other  negative endocrine ROS    Renal/GU negative Renal ROS     Musculoskeletal  (+) Arthritis ,    Abdominal   Peds  Hematology negative hematology ROS (+)   Anesthesia Other Findings LEFT BREAST PAPILLOMA  Reproductive/Obstetrics                             Anesthesia Physical Anesthesia Plan  ASA: 1  Anesthesia Plan: General   Post-op Pain Management:    Induction: Intravenous  PONV Risk Score and Plan: 3 and Ondansetron, Dexamethasone and Treatment may vary due to age or medical condition  Airway Management Planned: LMA  Additional Equipment:   Intra-op Plan:   Post-operative Plan: Extubation in OR  Informed Consent: I have reviewed the patients History and Physical, chart, labs and discussed the procedure including the risks, benefits and alternatives for the proposed anesthesia with the patient or authorized representative who has indicated his/her understanding and acceptance.     Dental advisory given  Plan Discussed with: CRNA  Anesthesia Plan Comments:        Anesthesia Quick Evaluation

## 2022-04-05 ENCOUNTER — Ambulatory Visit (HOSPITAL_BASED_OUTPATIENT_CLINIC_OR_DEPARTMENT_OTHER): Payer: Medicare Other | Admitting: Anesthesiology

## 2022-04-05 ENCOUNTER — Other Ambulatory Visit: Payer: Self-pay

## 2022-04-05 ENCOUNTER — Encounter (HOSPITAL_BASED_OUTPATIENT_CLINIC_OR_DEPARTMENT_OTHER)
Admission: RE | Disposition: A | Discharge status: Home / Self Care | Payer: Self-pay | Source: Ambulatory Visit | Attending: Surgery

## 2022-04-05 ENCOUNTER — Ambulatory Visit
Admission: RE | Admit: 2022-04-05 | Discharge: 2022-04-05 | Disposition: A | Discharge status: Home / Self Care | Payer: Medicare Other | Source: Ambulatory Visit | Attending: Surgery | Admitting: Surgery

## 2022-04-05 ENCOUNTER — Encounter (HOSPITAL_BASED_OUTPATIENT_CLINIC_OR_DEPARTMENT_OTHER): Payer: Self-pay | Admitting: Surgery

## 2022-04-05 ENCOUNTER — Ambulatory Visit (HOSPITAL_BASED_OUTPATIENT_CLINIC_OR_DEPARTMENT_OTHER)
Admission: RE | Admit: 2022-04-05 | Discharge: 2022-04-05 | Disposition: A | Discharge status: Home / Self Care | Payer: Medicare Other | Source: Ambulatory Visit | Attending: Surgery | Admitting: Surgery

## 2022-04-05 DIAGNOSIS — N6032 Fibrosclerosis of left breast: Secondary | ICD-10-CM | POA: Insufficient documentation

## 2022-04-05 DIAGNOSIS — Z87891 Personal history of nicotine dependence: Secondary | ICD-10-CM | POA: Insufficient documentation

## 2022-04-05 DIAGNOSIS — D369 Benign neoplasm, unspecified site: Secondary | ICD-10-CM

## 2022-04-05 DIAGNOSIS — D242 Benign neoplasm of left breast: Secondary | ICD-10-CM

## 2022-04-05 DIAGNOSIS — M1990 Unspecified osteoarthritis, unspecified site: Secondary | ICD-10-CM | POA: Insufficient documentation

## 2022-04-05 DIAGNOSIS — N6002 Solitary cyst of left breast: Secondary | ICD-10-CM | POA: Insufficient documentation

## 2022-04-05 DIAGNOSIS — R928 Other abnormal and inconclusive findings on diagnostic imaging of breast: Secondary | ICD-10-CM | POA: Diagnosis not present

## 2022-04-05 DIAGNOSIS — Z01818 Encounter for other preprocedural examination: Secondary | ICD-10-CM

## 2022-04-05 HISTORY — DX: Unspecified osteoarthritis, unspecified site: M19.90

## 2022-04-05 HISTORY — DX: Sciatica, unspecified side: M54.30

## 2022-04-05 HISTORY — PX: BREAST LUMPECTOMY WITH RADIOACTIVE SEED LOCALIZATION: SHX6424

## 2022-04-05 SURGERY — BREAST LUMPECTOMY WITH RADIOACTIVE SEED LOCALIZATION
Anesthesia: General | Site: Breast | Laterality: Left

## 2022-04-05 MED ORDER — ONDANSETRON HCL 4 MG/2ML IJ SOLN
INTRAMUSCULAR | Status: AC
  Filled 2022-04-05: qty 2

## 2022-04-05 MED ORDER — ACETAMINOPHEN 500 MG PO TABS
1000.0000 mg | ORAL_TABLET | ORAL | Status: AC
  Administered 2022-04-05: 1000 mg via ORAL

## 2022-04-05 MED ORDER — ONDANSETRON HCL 4 MG/2ML IJ SOLN: 4.0000 mg | Freq: Once | INTRAMUSCULAR | Status: DC | PRN

## 2022-04-05 MED ORDER — SODIUM CHLORIDE 0.9 % IV SOLN
INTRAVENOUS | Status: AC
  Filled 2022-04-05: qty 10

## 2022-04-05 MED ORDER — CHLORHEXIDINE GLUCONATE CLOTH 2 % EX PADS: 6.0000 | MEDICATED_PAD | Freq: Once | CUTANEOUS | Status: DC

## 2022-04-05 MED ORDER — CEFAZOLIN SODIUM-DEXTROSE 2-4 GM/100ML-% IV SOLN
INTRAVENOUS | Status: AC
  Filled 2022-04-05: qty 100

## 2022-04-05 MED ORDER — ACETAMINOPHEN 500 MG PO TABS
ORAL_TABLET | ORAL | Status: AC
  Filled 2022-04-05: qty 2

## 2022-04-05 MED ORDER — GABAPENTIN 300 MG PO CAPS
300.0000 mg | ORAL_CAPSULE | ORAL | Status: AC
  Administered 2022-04-05: 300 mg via ORAL

## 2022-04-05 MED ORDER — PROPOFOL 10 MG/ML IV BOLUS
INTRAVENOUS | Status: AC
  Filled 2022-04-05: qty 20

## 2022-04-05 MED ORDER — LIDOCAINE HCL (CARDIAC) PF 100 MG/5ML IV SOSY
PREFILLED_SYRINGE | INTRAVENOUS | Status: DC | PRN
  Administered 2022-04-05: 100 mg via INTRAVENOUS

## 2022-04-05 MED ORDER — CEFAZOLIN SODIUM-DEXTROSE 2-4 GM/100ML-% IV SOLN: 2.0000 g | INTRAVENOUS | Status: DC

## 2022-04-05 MED ORDER — SODIUM CHLORIDE 0.9 % IV SOLN
INTRAVENOUS | Status: DC | PRN
  Administered 2022-04-05: 500 mL

## 2022-04-05 MED ORDER — LACTATED RINGERS IV SOLN: INTRAVENOUS | Status: DC | PRN

## 2022-04-05 MED ORDER — BUPIVACAINE-EPINEPHRINE (PF) 0.25% -1:200000 IJ SOLN
INTRAMUSCULAR | Status: AC
  Filled 2022-04-05: qty 30

## 2022-04-05 MED ORDER — CEFAZOLIN SODIUM-DEXTROSE 2-3 GM-%(50ML) IV SOLR
INTRAVENOUS | Status: DC | PRN
  Administered 2022-04-05: 2 g via INTRAVENOUS

## 2022-04-05 MED ORDER — OXYCODONE HCL 5 MG PO TABS: 5.0000 mg | ORAL_TABLET | Freq: Four times a day (QID) | ORAL | 0 refills | Status: DC | PRN

## 2022-04-05 MED ORDER — LIDOCAINE 2% (20 MG/ML) 5 ML SYRINGE
INTRAMUSCULAR | Status: AC
  Filled 2022-04-05: qty 5

## 2022-04-05 MED ORDER — AMISULPRIDE (ANTIEMETIC) 5 MG/2ML IV SOLN: 10.0000 mg | Freq: Once | INTRAVENOUS | Status: DC | PRN

## 2022-04-05 MED ORDER — LACTATED RINGERS IV SOLN: INTRAVENOUS | Status: DC

## 2022-04-05 MED ORDER — PROPOFOL 500 MG/50ML IV EMUL
INTRAVENOUS | Status: DC | PRN
  Administered 2022-04-05: 200 ug/kg/min via INTRAVENOUS

## 2022-04-05 MED ORDER — PROPOFOL 10 MG/ML IV BOLUS
INTRAVENOUS | Status: DC | PRN
  Administered 2022-04-05 (×2): 50 mg via INTRAVENOUS

## 2022-04-05 MED ORDER — DEXAMETHASONE SODIUM PHOSPHATE 10 MG/ML IJ SOLN
INTRAMUSCULAR | Status: DC | PRN
  Administered 2022-04-05: 5 mg via INTRAVENOUS

## 2022-04-05 MED ORDER — GABAPENTIN 300 MG PO CAPS
ORAL_CAPSULE | ORAL | Status: AC
  Filled 2022-04-05: qty 1

## 2022-04-05 MED ORDER — FENTANYL CITRATE (PF) 100 MCG/2ML IJ SOLN
INTRAMUSCULAR | Status: DC | PRN
  Administered 2022-04-05: 50 ug via INTRAVENOUS

## 2022-04-05 MED ORDER — FENTANYL CITRATE (PF) 100 MCG/2ML IJ SOLN
INTRAMUSCULAR | Status: AC
  Filled 2022-04-05: qty 2

## 2022-04-05 MED ORDER — ONDANSETRON HCL 4 MG/2ML IJ SOLN
INTRAMUSCULAR | Status: DC | PRN
  Administered 2022-04-05: 4 mg via INTRAVENOUS

## 2022-04-05 MED ORDER — EPHEDRINE SULFATE (PRESSORS) 50 MG/ML IJ SOLN
INTRAMUSCULAR | Status: DC | PRN
  Administered 2022-04-05 (×2): 10 mg via INTRAVENOUS

## 2022-04-05 MED ORDER — DEXAMETHASONE SODIUM PHOSPHATE 10 MG/ML IJ SOLN
INTRAMUSCULAR | Status: AC
  Filled 2022-04-05: qty 1

## 2022-04-05 MED ORDER — BUPIVACAINE-EPINEPHRINE (PF) 0.25% -1:200000 IJ SOLN
INTRAMUSCULAR | Status: DC | PRN
  Administered 2022-04-05: 20 mL via PERINEURAL

## 2022-04-05 MED ORDER — FENTANYL CITRATE (PF) 100 MCG/2ML IJ SOLN: 25.0000 ug | INTRAMUSCULAR | Status: DC | PRN

## 2022-04-05 SURGICAL SUPPLY — 49 items
ADH SKN CLS APL DERMABOND .7 (GAUZE/BANDAGES/DRESSINGS) ×1
APL PRP STRL LF DISP 70% ISPRP (MISCELLANEOUS) ×1
APPLIER CLIP 9.375 MED OPEN (MISCELLANEOUS)
APR CLP MED 9.3 20 MLT OPN (MISCELLANEOUS)
BINDER BREAST LRG (GAUZE/BANDAGES/DRESSINGS) IMPLANT
BINDER BREAST MEDIUM (GAUZE/BANDAGES/DRESSINGS) IMPLANT
BINDER BREAST XLRG (GAUZE/BANDAGES/DRESSINGS) IMPLANT
BINDER BREAST XXLRG (GAUZE/BANDAGES/DRESSINGS) IMPLANT
BLADE SURG 15 STRL LF DISP TIS (BLADE) ×1 IMPLANT
BLADE SURG 15 STRL SS (BLADE) ×1
CANISTER SUC SOCK COL 7IN (MISCELLANEOUS) IMPLANT
CANISTER SUCT 1200ML W/VALVE (MISCELLANEOUS) IMPLANT
CHLORAPREP W/TINT 26 (MISCELLANEOUS) ×1 IMPLANT
CLIP APPLIE 9.375 MED OPEN (MISCELLANEOUS) IMPLANT
COVER BACK TABLE 60X90IN (DRAPES) ×1 IMPLANT
COVER MAYO STAND STRL (DRAPES) ×1 IMPLANT
COVER PROBE CYLINDRICAL 5X96 (MISCELLANEOUS) ×1 IMPLANT
DERMABOND ADVANCED .7 DNX12 (GAUZE/BANDAGES/DRESSINGS) ×1 IMPLANT
DRAPE LAPAROSCOPIC ABDOMINAL (DRAPES) IMPLANT
DRAPE LAPAROTOMY 100X72 PEDS (DRAPES) ×1 IMPLANT
DRAPE UTILITY XL STRL (DRAPES) ×1 IMPLANT
ELECT COATED BLADE 2.86 ST (ELECTRODE) ×1 IMPLANT
ELECT REM PT RETURN 9FT ADLT (ELECTROSURGICAL) ×1
ELECTRODE REM PT RTRN 9FT ADLT (ELECTROSURGICAL) ×1 IMPLANT
GLOVE BIOGEL PI IND STRL 8 (GLOVE) ×1 IMPLANT
GLOVE ECLIPSE 8.0 STRL XLNG CF (GLOVE) ×1 IMPLANT
GOWN STRL REUS W/ TWL LRG LVL3 (GOWN DISPOSABLE) ×2 IMPLANT
GOWN STRL REUS W/ TWL XL LVL3 (GOWN DISPOSABLE) ×1 IMPLANT
GOWN STRL REUS W/TWL LRG LVL3 (GOWN DISPOSABLE) ×2
GOWN STRL REUS W/TWL XL LVL3 (GOWN DISPOSABLE) ×1
HEMOSTAT ARISTA ABSORB 3G PWDR (HEMOSTASIS) IMPLANT
HEMOSTAT SNOW SURGICEL 2X4 (HEMOSTASIS) IMPLANT
KIT MARKER MARGIN INK (KITS) ×1 IMPLANT
NDL HYPO 25X1 1.5 SAFETY (NEEDLE) ×1 IMPLANT
NEEDLE HYPO 25X1 1.5 SAFETY (NEEDLE) ×1 IMPLANT
NS IRRIG 1000ML POUR BTL (IV SOLUTION) ×1 IMPLANT
PACK BASIN DAY SURGERY FS (CUSTOM PROCEDURE TRAY) ×1 IMPLANT
PENCIL SMOKE EVACUATOR (MISCELLANEOUS) ×1 IMPLANT
SLEEVE SCD COMPRESS KNEE MED (STOCKING) ×1 IMPLANT
SPIKE FLUID TRANSFER (MISCELLANEOUS) IMPLANT
SPONGE T-LAP 4X18 ~~LOC~~+RFID (SPONGE) ×1 IMPLANT
SUT MNCRL AB 4-0 PS2 18 (SUTURE) ×1 IMPLANT
SUT SILK 2 0 SH (SUTURE) IMPLANT
SUT VICRYL 3-0 CR8 SH (SUTURE) ×1 IMPLANT
SYR CONTROL 10ML LL (SYRINGE) ×1 IMPLANT
TOWEL GREEN STERILE FF (TOWEL DISPOSABLE) ×1 IMPLANT
TRAY FAXITRON CT DISP (TRAY / TRAY PROCEDURE) ×1 IMPLANT
TUBE CONNECTING 20X1/4 (TUBING) IMPLANT
YANKAUER SUCT BULB TIP NO VENT (SUCTIONS) IMPLANT

## 2022-04-05 NOTE — H&P (Signed)
istory of Present Illness: Kordelia Revak is a 72 y.o. female who is seen today as an office consultation for evaluation of New Consultation (left breast mass )  Patient presents for evaluation of left breast mammogram and left breast mass. She was noted to have left nipple discharge for 1 year which is bloody in nature. Imaging revealed 2 areas on left breast. Both were biopsied. 1 showed fibrocystic change in the send papilloma which was under the nipple measuring 5 mm. She denies any issues with her right breast.  Review of Systems: A complete review of systems was obtained from the patient. I have reviewed this information and discussed as appropriate with the patient. See HPI as well for other ROS.    Medical History: Past Medical History:  Diagnosis Date  Arthritis   There is no problem list on file for this patient.  Past Surgical History:  Procedure Laterality Date  APPENDECTOMY  COLONOSCOPY    Allergies  Allergen Reactions  Celecoxib Rash  Prednisone Palpitations   Current Outpatient Medications on File Prior to Visit  Medication Sig Dispense Refill  doxycycline (VIBRAMYCIN) 100 MG capsule  tretinoin (RETIN-A) 0.1 % cream APPLY A PEA SIZED AMOUNT TO SKIN 3 NIGHTS PER WEEK AS DIRECTED, THEN NIGHTLY AS TOLERATED   No current facility-administered medications on file prior to visit.   No family history on file.   Social History   Tobacco Use  Smoking Status Former  Types: Cigarettes  Smokeless Tobacco Never    Social History   Socioeconomic History  Marital status: Married  Tobacco Use  Smoking status: Former  Types: Cigarettes  Smokeless tobacco: Never  Substance and Sexual Activity  Alcohol use: Yes  Drug use: Never   Objective:   Vitals:  03/05/22 1131  BP: (!) 142/82  Pulse: 80  Temp: 36.3 C (97.3 F)  SpO2: 99%  Weight: 70.3 kg (155 lb)  Height: 157.5 cm ('5\' 2"'$ )  PainSc: 2   Body mass index is 28.35 kg/m.  General appearance: Female  no apparent distress  H EENT: No evidence of jaundice, extraocular movements intact  Neck: Normal trachea midline no lymphadenopathy  Chest: Large transverse scar noted upper chest. Both breast of dense tissue. No definitive mass bilaterally. No left nipple discharge on today's exam. No evidence of axillary or supraclavicular adenopathy  Cardiovascular: Normal sinus rhythm  Pulmonary: Work of breathing normal, no stridor  Extremities: No evidence of clubbing cyanosis no edema  Labs, Imaging and Diagnostic Testing: Breast, left, needle core biopsy, 4:00, 6cmfn, ribbon clip - FIBROCYSTIC CHANGES INCLUDING APOCRINE METAPLASIA AND FOCAL USUAL DUCTAL HYPERPLASIA (UDH) - NEGATIVE FOR MALIGNANCY - SEE NOTE 2. Breast, left, needle core biopsy, 4:00, 1cmfn, coil clip - INTRADUCTAL PAPILLOMA - FIBROCYSTIC CHANGES - ADENOSIS - SEE NOTE Diagnosis Note 1. -2. These results were called to [facility] on 02/20/2022. Maretta Bees M.D. Pathologist, Electronic Signature (Case signed 02/20/2022) Specimen Gross and Clinical Information Specimen Comment 1. TIF 2:38, CIT < 1 min; mass; left spontaneous clear nipple discharge 2. TIF 2:49, CIT < 1 min; left spontaneous clear nipple discharge; mass Specimen(s) Obtained: 1. Breast, left, needle core biopsy, 4:00, 6cmfn, ribbon clip 2. Breast, left, needle core biopsy, 4:00, 1cmfn, coil clip Specimen Clinical Information 1. Possible papilloma, FCC, FA r/o malignancy 2. Possible ductal debris vs. papilloma R/O malignancy  CLINICAL DATA: Recall from screening mammography, possible small mass associated with calcifications involving the outer LEFT breast at middle to posterior depth.  EXAM:  DIGITAL DIAGNOSTIC UNILATERAL LEFT MAMMOGRAM WITH TOMOSYNTHESIS; ULTRASOUND LEFT BREAST LIMITED  TECHNIQUE: Left digital diagnostic mammography and breast tomosynthesis was performed.; Targeted ultrasound examination of the left breast  was performed.  COMPARISON: Previous exam(s).  ACR Breast Density Category b: There are scattered areas of fibroglandular density.  FINDINGS: Spot-compression CC and MLO views of the area of concern, spot magnification CC and mediolateral views of the calcifications and a full field mediolateral view were obtained.  Partially obscured isodense mass associated with faint round/punctate calcifications in the outer breast at middle to posterior depth. No associated architectural distortion. The mass localizes to the near 3 o'clock position on the mediolateral view.  Targeted ultrasound is performed, demonstrating an oval parallel hypoechoic mass with angular medial and lateral margins at the 4 o'clock position 6 cm from nipple measuring approximately 0.5 x 0.2 x 0.4 cm, containing calcifications, demonstrating no posterior characteristics and no internal power Doppler flow, corresponding to the screening mammographic finding.  Sonographic evaluation of the LEFT axilla demonstrates no pathologic lymphadenopathy.  IMPRESSION: 1. Indeterminate 0.5 cm mass involving the LOWER OUTER QUADRANT of the LEFT breast at 4 o'clock 6 cm from the nipple. While this may represent a complicated cyst containing calcifications, the mass is not entirely circumscribed. 2. No pathologic LEFT axillary lymphadenopathy.  RECOMMENDATION: Ultrasound-guided aspiration and potential core needle biopsy of the indeterminate LEFT breast mass.  I have discussed the findings and recommendations with the patient. The ultrasound aspiration and core needle biopsy procedures were discussed with the patient and her questions were answered. She wishes to proceed and the aspiration/potential biopsy has been scheduled by the Breast Imaging staff.  BI-RADS CATEGORY 4: Suspicious.   Electronically Signed By: Evangeline Dakin M.D. On: 02/14/2022 10:27  Assessment and Plan:   Diagnoses and all orders for this  visit:  Papilloma of left breast  Nipple discharge, bloody  Given bloody discharge recommend left breast seed lumpectomy to remove the papilloma. The other areas benign given her breast size can be left alone. Risks and benefits of this discussed as well as potential upgrade risk. Discussed observation as well but given the bloody nipple discharge, I feel left breast seed lumpectomy appropriate in the setting. Discussed watchful waiting and observation.  She is opted for left breast seed localized lumpectomy.  The procedure has been discussed with the patient. Alternatives to surgery have been discussed with the patient. Risks of surgery include bleeding, Infection, Seroma formation, death, and the need for further surgery. The patient understands and wishes to proceed.    Kennieth Francois, MD

## 2022-04-05 NOTE — Interval H&P Note (Signed)
History and Physical Interval Note:  04/05/2022 7:39 AM  Gina Huff  has presented today for surgery, with the diagnosis of LEFT BREAST PAPILLOMA.  The various methods of treatment have been discussed with the patient and family. After consideration of risks, benefits and other options for treatment, the patient has consented to  Procedure(s): LEFT BREAST LUMPECTOMY WITH RADIOACTIVE SEED LOCALIZATION (Left) as a surgical intervention.  The patient's history has been reviewed, patient examined, no change in status, stable for surgery.  I have reviewed the patient's chart and labs.  Questions were answered to the patient's satisfaction.     [place]

## 2022-04-05 NOTE — Anesthesia Postprocedure Evaluation (Signed)
Anesthesia Post Note  Patient: Gina Huff  Procedure(s) Performed: LEFT BREAST LUMPECTOMY WITH RADIOACTIVE SEED LOCALIZATION (Left: Breast)     Patient location during evaluation: PACU Anesthesia Type: General Level of consciousness: awake Pain management: pain level controlled Vital Signs Assessment: post-procedure vital signs reviewed and stable Respiratory status: spontaneous breathing, nonlabored ventilation and respiratory function stable Cardiovascular status: blood pressure returned to baseline and stable Postop Assessment: no apparent nausea or vomiting Anesthetic complications: no   No notable events documented.  Last Vitals:  Vitals:   04/05/22 0930 04/05/22 0944  BP: 127/70 138/68  Pulse: 62   Resp: 14   Temp:  (!) 36.3 C  SpO2: 96% 99%    Last Pain:  Vitals:   04/05/22 0944  TempSrc:   PainSc: 0-No pain                 Jaelyn Cloninger P Jeany [place]

## 2022-04-05 NOTE — Discharge Instructions (Addendum)
[place] Office Phone Number [phone]  BREAST BIOPSY/ PARTIAL MASTECTOMY: POST OP INSTRUCTIONS  Always review your discharge instruction sheet given to you by the facility where your surgery was performed.  IF YOU HAVE DISABILITY OR FAMILY LEAVE FORMS, YOU MUST BRING THEM TO THE OFFICE FOR PROCESSING.  DO NOT GIVE THEM TO YOUR DOCTOR.  A prescription for pain medication may be given to you upon discharge.  Take your pain medication as prescribed, if needed.  If narcotic pain medicine is not needed, then you may take acetaminophen (Tylenol) or ibuprofen (Advil) as needed. Take your usually prescribed medications unless otherwise directed If you need a refill on your pain medication, please contact your pharmacy.  They will contact our office to request authorization.  Prescriptions will not be filled after 5pm or on week-ends. You should eat very light the first 24 hours after surgery, such as soup, crackers, pudding, etc.  Resume your normal diet the day after surgery. Most patients will experience some swelling and bruising in the breast.  Ice packs and a good support bra will help.  Swelling and bruising can take several days to resolve.  It is common to experience some constipation if taking pain medication after surgery.  Increasing fluid intake and taking a stool softener will usually help or prevent this problem from occurring.  A mild laxative (Milk of Magnesia or Miralax) should be taken according to package directions if there are no bowel movements after 48 hours. Unless discharge instructions indicate otherwise, you may remove your bandages 24-48 hours after surgery, and you may shower at that time.  You may have steri-strips (small skin tapes) in place directly over the incision.  These strips should be left on the skin for 7-10 days.  If your surgeon used skin glue on the incision, you may shower in 24 hours.  The glue will flake off over the next 2-3 weeks.  Any  sutures or staples will be removed at the office during your follow-up visit. ACTIVITIES:  You may resume regular daily activities (gradually increasing) beginning the next day.  Wearing a good support bra or sports bra minimizes pain and swelling.  You may have sexual intercourse when it is comfortable. You may drive when you no longer are taking prescription pain medication, you can comfortably wear a seatbelt, and you can safely maneuver your car and apply brakes. RETURN TO WORK:  ______________________________________________________________________________________ Dennis Bast should see your doctor in the office for a follow-up appointment approximately two weeks after your surgery.  Your doctor's nurse will typically make your follow-up appointment when she calls you with your pathology report.  Expect your pathology report 2-3 business days after your surgery.  You may call to check if you do not hear from [place] after three days. OTHER INSTRUCTIONS: _______________________________________________________________________________________________ _____________________________________________________________________________________________________________________________________ _____________________________________________________________________________________________________________________________________ _____________________________________________________________________________________________________________________________________  WHEN TO CALL YOUR DOCTOR: Fever over 101.0 Nausea and/or vomiting. Extreme swelling or bruising. Continued bleeding from incision. Increased pain, redness, or drainage from the incision.  The clinic staff is available to answer your questions during regular business hours.  Please don't hesitate to call and ask to speak to one of the nurses for clinical concerns.  If you have a medical emergency, go to the nearest emergency room or call 911.  A surgeon from [facility] is always on call at the hospital.  For further questions, please visit [email or web address]    No Tylenol until after 2:00pm today, if needed.   Post Anesthesia Home Care Instructions  Activity: Get plenty of rest for the remainder of the day. A responsible individual must stay with you for 24 hours following the procedure.  For the next 24 hours, DO NOT: -Drive a car -Paediatric nurse -Drink alcoholic beverages -Take any medication unless instructed by your physician -Make any legal decisions or sign important papers.  Meals: Start with liquid foods such as gelatin or soup. Progress to regular foods as tolerated. Avoid greasy, spicy, heavy foods. If nausea and/or vomiting occur, drink only clear liquids until the nausea and/or vomiting subsides. Call your physician if vomiting continues.  Special Instructions/Symptoms: Your throat may feel dry or sore from the anesthesia or the breathing tube placed in your throat during surgery. If this causes discomfort, gargle with warm salt water. The discomfort should disappear within 24 hours.  If you had a scopolamine patch placed behind your ear for the management of post- operative nausea and/or vomiting:  1. The medication in the patch is effective for 72 hours, after which it should be removed.  Wrap patch in a tissue and discard in the trash. Wash hands thoroughly with soap and water. 2. You may remove the patch earlier than 72 hours if you experience unpleasant side effects which may include dry mouth, dizziness or visual disturbances. 3. Avoid touching the patch. Wash your hands with soap and water after contact with the patch.

## 2022-04-05 NOTE — Transfer of Care (Signed)
Immediate Anesthesia Transfer of Care Note  Patient: Gina Huff  Procedure(s) Performed: LEFT BREAST LUMPECTOMY WITH RADIOACTIVE SEED LOCALIZATION (Left: Breast)  Patient Location: PACU  Anesthesia Type:General  Level of Consciousness: awake and alert   Airway & Oxygen Therapy: Patient Spontanous Breathing and Patient connected to face mask oxygen  Post-op Assessment: Report given to RN and Post -op Vital signs reviewed and stable  Post vital signs: Reviewed and stable  Last Vitals:  Vitals Value Taken Time  BP 100/64 04/05/22 0904  Temp 36.3 C 04/05/22 0904  Pulse 79 04/05/22 0906  Resp 16 04/05/22 0906  SpO2 99 % 04/05/22 0906  Vitals shown include unvalidated device data.  Last Pain:  Vitals:   04/05/22 0751  TempSrc: Oral  PainSc: 0-No pain         Complications: No notable events documented.

## 2022-04-05 NOTE — Op Note (Signed)
Preoperative diagnosis: Left breast papilloma  Postoperative diagnosis: Same  Procedure: Left breast seed localized lumpectomy  Surgeon: Erroll Luna, MD  Anesthesia: LMA with 0.25% Marcaine with epinephrine  EBL: Minimal  Specimen: Left breast tissue with seed and clip verified by intraoperative imaging  Drains: None  Indications for procedure: The patient is a 72 year old female who was found of a mammographic abnormality on recent screening mammogram.  Core biopsy showed papilloma.  We discussed the pros and cons of excision and also discussed watchful waiting.  After discussion of all the options she wished to proceed with left breast seed localized lumpectomy.The procedure has been discussed with the patient. Alternatives to surgery have been discussed with the patient.  Risks of surgery include bleeding,  Infection,  Seroma formation, death,  and the need for further surgery.   The patient understands and wishes to proceed.   Description of procedure: The patient was met in the holding and questions were answered.  She underwent seed placement as an outpatient.  Left side was marked as correct site.  She was taken back to the operating room.  She is placed supine upon the OR table.  After induction of general esthesia, left breast was prepped and draped in sterile fashion timeout performed.  Neoprobe used to identify the seed and under the left nipple.  A curvilinear incision was made along the lateral border of the nipple areolar complex.  Dissection was carried down through the subcutaneous tissues.  The seed and clip were identified and excised with grossly negative margins.  The intraoperative imaging revealed both seed and clip to be present.  Local anesthetic was infiltrated.  The deep tissue layers were approximated with 3-0 Vicryl.  4 Monocryl was used to close the skin.  Dermabond applied.  All final counts were found to be correct.  Breast binder placed.  The patient was awoke  extubated taken recovery in satisfactory condition.

## 2022-04-06 ENCOUNTER — Encounter (HOSPITAL_BASED_OUTPATIENT_CLINIC_OR_DEPARTMENT_OTHER): Payer: Self-pay | Admitting: Surgery

## 2022-04-06 LAB — SURGICAL PATHOLOGY

## 2022-04-08 ENCOUNTER — Encounter: Payer: Self-pay | Admitting: Surgery

## 2022-04-26 ENCOUNTER — Encounter (HOSPITAL_COMMUNITY): Payer: Self-pay

## 2022-06-05 DIAGNOSIS — M5416 Radiculopathy, lumbar region: Secondary | ICD-10-CM | POA: Diagnosis not present

## 2022-07-04 DIAGNOSIS — M5416 Radiculopathy, lumbar region: Secondary | ICD-10-CM | POA: Diagnosis not present

## 2022-07-04 DIAGNOSIS — M5451 Vertebrogenic low back pain: Secondary | ICD-10-CM | POA: Diagnosis not present

## 2022-07-11 DIAGNOSIS — M5416 Radiculopathy, lumbar region: Secondary | ICD-10-CM | POA: Diagnosis not present

## 2022-07-11 DIAGNOSIS — M5451 Vertebrogenic low back pain: Secondary | ICD-10-CM | POA: Diagnosis not present

## 2022-07-18 DIAGNOSIS — M5416 Radiculopathy, lumbar region: Secondary | ICD-10-CM | POA: Diagnosis not present

## 2022-07-18 DIAGNOSIS — M5451 Vertebrogenic low back pain: Secondary | ICD-10-CM | POA: Diagnosis not present

## 2022-07-25 DIAGNOSIS — M5416 Radiculopathy, lumbar region: Secondary | ICD-10-CM | POA: Diagnosis not present

## 2022-07-25 DIAGNOSIS — M5451 Vertebrogenic low back pain: Secondary | ICD-10-CM | POA: Diagnosis not present

## 2022-07-26 ENCOUNTER — Ambulatory Visit
Admission: RE | Admit: 2022-07-26 | Discharge: 2022-07-26 | Disposition: A | Discharge status: Home / Self Care | Payer: Medicare Other | Source: Ambulatory Visit | Attending: Family Medicine | Admitting: Family Medicine

## 2022-07-26 VITALS — BP 151/70 | HR 80 | Temp 98.7°F | Resp 16

## 2022-07-26 DIAGNOSIS — J069 Acute upper respiratory infection, unspecified: Secondary | ICD-10-CM

## 2022-07-26 DIAGNOSIS — D259 Leiomyoma of uterus, unspecified: Secondary | ICD-10-CM | POA: Insufficient documentation

## 2022-07-26 DIAGNOSIS — M5430 Sciatica, unspecified side: Secondary | ICD-10-CM | POA: Insufficient documentation

## 2022-07-26 MED ORDER — HYDROCODONE BIT-HOMATROP MBR 5-1.5 MG/5ML PO SOLN: 2.5000 mL | Freq: Four times a day (QID) | ORAL | 0 refills | Status: AC | PRN

## 2022-07-26 NOTE — ED Triage Notes (Signed)
Pt reports cough and congestion x 3 days. Pt reports a sore throat. Pt is taking tylenol.

## 2022-07-26 NOTE — ED Provider Notes (Signed)
[facility]   [id number] 07/26/22 Arrival Time: 4098  ASSESSMENT & PLAN:  1. Viral URI with cough    Discussed typical duration of likely viral illness. Nno resp distress. OTC symptom care as needed.  New Prescriptions   HYDROCODONE BIT-HOMATROPINE (HYCODAN) 5-1.5 MG/5ML SYRUP    Take 2.5-5 mLs by mouth every 6 (six) hours as needed for cough.     Follow-up Information     Annamaria Helling, MD.   Specialty: Obstetrics and Gynecology Why: If worsening or failing to improve as anticipated. Contact information: [address] [place] [address] [phone]                 Reviewed expectations re: course of current medical issues. Questions answered. Outlined signs and symptoms indicating need for more acute intervention. Understanding verbalized. After Visit Summary given.   SUBJECTIVE: History from: Patient. Gina Huff is a 72 y.o. female. Pt reports cough and congestion x 3 days. Pt reports a sore throat. Pt is taking tylenol.  Denies: fever and difficulty breathing. Normal PO intake without n/v/d.  OBJECTIVE:  Vitals:   07/26/22 0945  BP: (!) 151/70  Pulse: 80  Resp: 16  Temp: 98.7 F (37.1 C)  TempSrc: Oral  SpO2: 95%    General appearance: alert; no distress Eyes: PERRLA; EOMI; conjunctiva normal HENT: [place]; AT; with nasal congestion; throat with cobblestoning Neck: supple  Lungs: speaks full sentences without difficulty; unlabored; CTAB Extremities: no edema Skin: warm and dry Neurologic: normal gait Psychological: alert and cooperative; normal mood and affect   Allergies  Allergen Reactions   Celebrex [Celecoxib]    Prednisone     Past Medical History:  Diagnosis Date   Arthritis    Sciatica    Social History   Socioeconomic History   Marital status: Married    Spouse name: Not on file   Number of children: Not on file   Years of education: Not on file   Highest education level: Not on file   Occupational History   Not on file  Tobacco Use   Smoking status: Never   Smokeless tobacco: Never  Substance and Sexual Activity   Alcohol use: Yes    Alcohol/week: 2.0 standard drinks of alcohol    Types: 2 Standard drinks or equivalent per week   Drug use: Never   Sexual activity: Not on file  Other Topics Concern   Not on file  Social History Narrative   Not on file   Social Determinants of Health   Financial Resource Strain: Not on file  Food Insecurity: Not on file  Transportation Needs: Not on file  Physical Activity: Not on file  Stress: Not on file  Social Connections: Not on file  Intimate Partner Violence: Not on file   History reviewed. No pertinent family history. Past Surgical History:  Procedure Laterality Date   APPENDECTOMY     BREAST BIOPSY Left 02/19/2022   [place] LT BREAST BX W LOC DEV 1ST LESION IMG BX SPEC US GUIDE 02/19/2022 [facility]   BREAST BIOPSY Left 02/19/2022   [place] LT BREAST BX W LOC DEV EA ADD LESION IMG BX SPEC US GUIDE 02/19/2022 [facility]   BREAST BIOPSY  04/04/2022   MM LT RADIOACTIVE SEED LOC MAMMO GUIDE 04/04/2022 [facility]   BREAST LUMPECTOMY WITH RADIOACTIVE SEED LOCALIZATION Left 04/05/2022   Procedure: LEFT BREAST LUMPECTOMY WITH RADIOACTIVE SEED LOCALIZATION;  Surgeon: Harriette Bouillon, MD;  Location: [facility];  Service: General;  Laterality: Left;   CHOLECYSTECTOMY     COLONOSCOPY W/ POLYPECTOMY       Mardella Layman, MD 07/26/22 1007

## 2022-07-26 NOTE — Discharge Instructions (Signed)
Be aware, your cough medication may cause drowsiness. Please do not drive, operate heavy machinery or make important decisions while on this medication, it can cloud your judgement.  

## 2022-07-27 ENCOUNTER — Telehealth: Payer: Self-pay

## 2022-07-27 NOTE — Telephone Encounter (Signed)
Patient called stating her throat was hurting worst and she would like a script sent to the pharmacy. I tried to calling the patient back but was sent to voicemail. I left a message stating we had received her phone message that at this time we could not send in script due to the provider that was here today didn't see her yesterday and if she was worst she needed to be seen.

## 2022-08-01 DIAGNOSIS — M5451 Vertebrogenic low back pain: Secondary | ICD-10-CM | POA: Diagnosis not present

## 2022-08-01 DIAGNOSIS — M5416 Radiculopathy, lumbar region: Secondary | ICD-10-CM | POA: Diagnosis not present

## 2022-08-27 DIAGNOSIS — L700 Acne vulgaris: Secondary | ICD-10-CM | POA: Diagnosis not present

## 2022-08-27 DIAGNOSIS — L680 Hirsutism: Secondary | ICD-10-CM | POA: Diagnosis not present

## 2022-12-17 ENCOUNTER — Other Ambulatory Visit: Payer: Self-pay | Admitting: Family Medicine

## 2022-12-17 DIAGNOSIS — H25011 Cortical age-related cataract, right eye: Secondary | ICD-10-CM | POA: Diagnosis not present

## 2022-12-17 DIAGNOSIS — H53002 Unspecified amblyopia, left eye: Secondary | ICD-10-CM | POA: Diagnosis not present

## 2022-12-17 DIAGNOSIS — H2511 Age-related nuclear cataract, right eye: Secondary | ICD-10-CM | POA: Diagnosis not present

## 2022-12-17 DIAGNOSIS — Z1231 Encounter for screening mammogram for malignant neoplasm of breast: Secondary | ICD-10-CM

## 2022-12-17 DIAGNOSIS — H5211 Myopia, right eye: Secondary | ICD-10-CM | POA: Diagnosis not present

## 2023-01-28 ENCOUNTER — Ambulatory Visit
Admission: RE | Admit: 2023-01-28 | Discharge: 2023-01-28 | Disposition: A | Discharge status: Home / Self Care | Payer: No Typology Code available for payment source | Source: Ambulatory Visit | Attending: Family Medicine | Admitting: Family Medicine

## 2023-01-28 DIAGNOSIS — Z1231 Encounter for screening mammogram for malignant neoplasm of breast: Secondary | ICD-10-CM | POA: Diagnosis not present

## 2023-06-15 IMAGING — MR MR BREAST BILAT WO/W CM
8 of 12 series · 33 of 48 positions shown · IV contrast (7 ml gadavist)
Comparison: Previous exam(s).

CLINICAL DATA: 70-year-old female with spontaneous left nipple
discharge.

LABS:  None performed on site.
EXAM:
BILATERAL BREAST MRI WITH AND WITHOUT CONTRAST
TECHNIQUE: Multiplanar, multisequence MR images of both breasts were obtained
prior to and following the intravenous administration of 7 ml of
Gadavist.

[Series 2: t2_tirm_tra ipat (a-p) · axial · 3.0mm · 0.70mm/px · 1 of 55 slices shown]
[im 1/55]
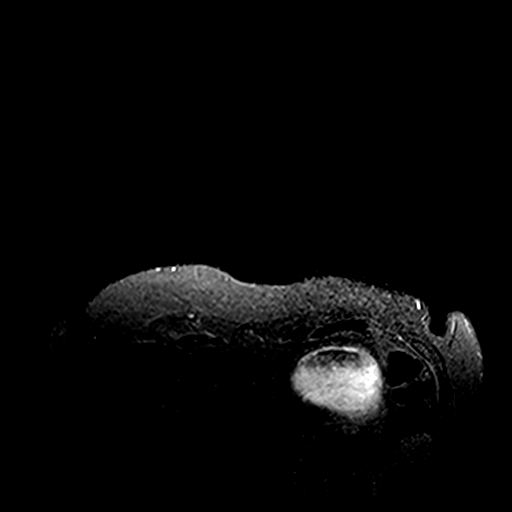

[Series 3: fl3d pre-cm no · axial · non-contrast · 1.2mm · 0.89mm/px · z∈[-50,+102]mm · 5 of 128 slices shown]
[im 1/128]
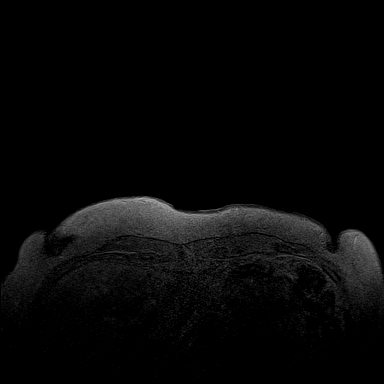
[im 32/128]
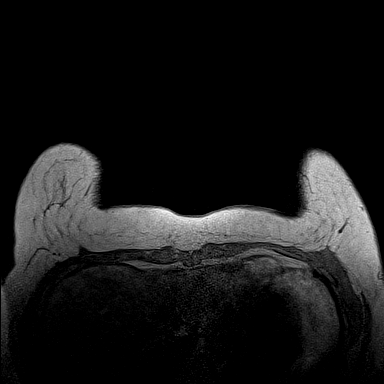
[im 64/128]
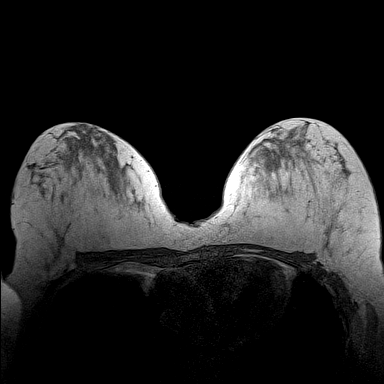
[im 96/128]
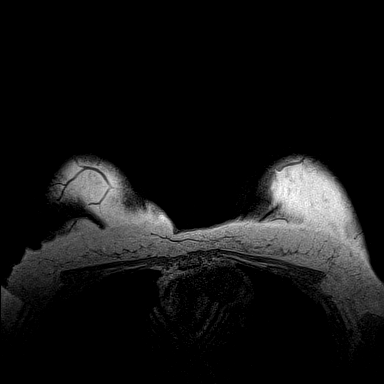
[im 128/128]
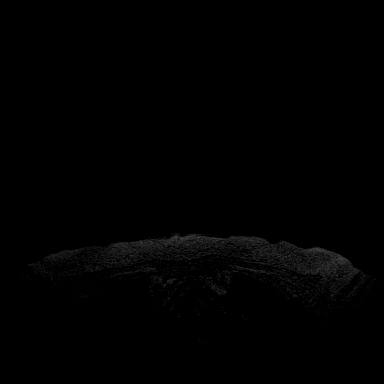

[Series 4: fl3d pre-cm · axial · non-contrast · 1.2mm · 0.89mm/px · z∈[-50,+102]mm · 5 of 128 slices shown]
[im 1/128]
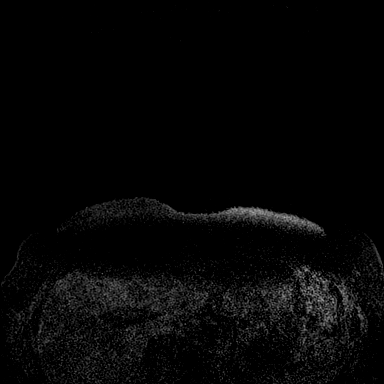
[im 32/128]
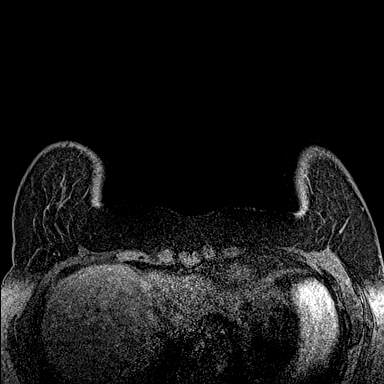
[im 64/128]
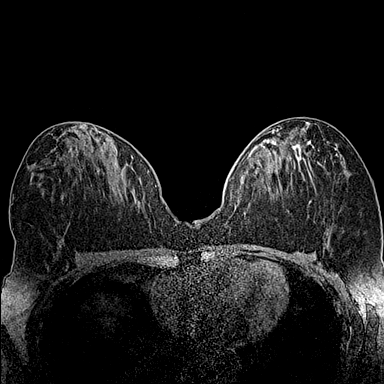
[im 96/128]
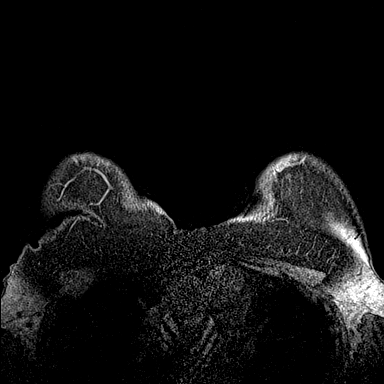
[im 128/128]
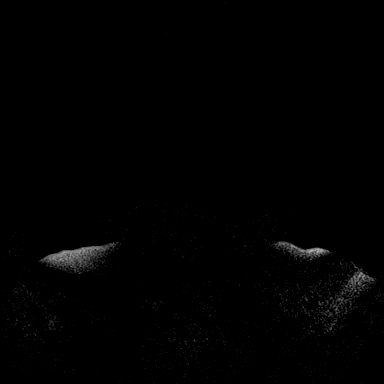

[Series 5: fl3d post-cm 20 · axial · 1.2mm · 0.89mm/px · z∈[-50,+102]mm · 5 of 128 slices shown (1 of 3)]
[im 1/128]
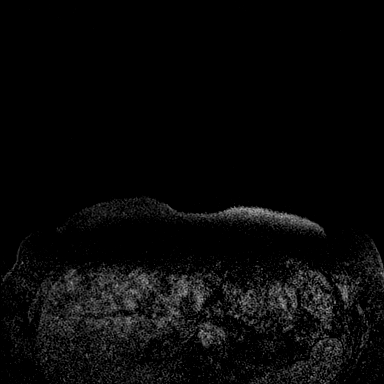
[im 32/128]
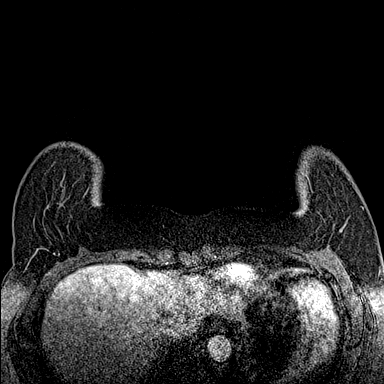
[im 64/128]
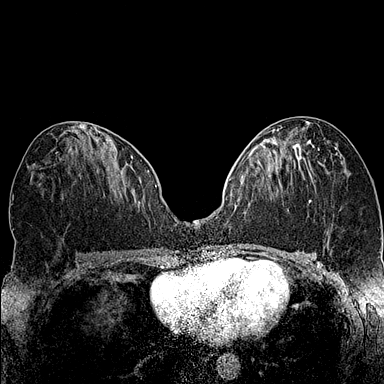
[im 96/128]
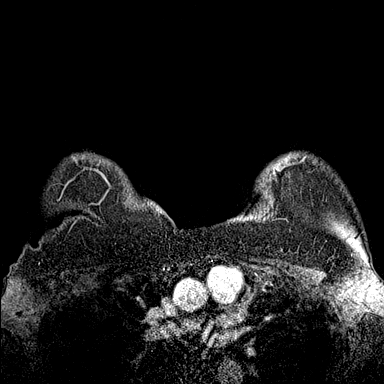
[im 128/128]
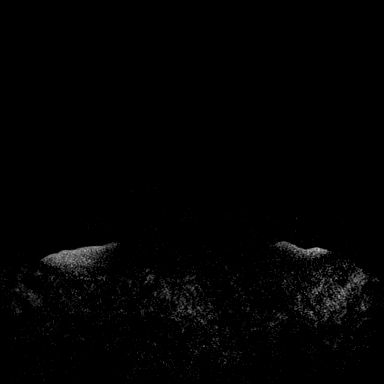

[Series 6: fl3d post-cm 20 · axial · 1.2mm · 0.89mm/px · z∈[-50,+102]mm · 5 of 128 slices shown (2 of 3)]
[im 1/128]
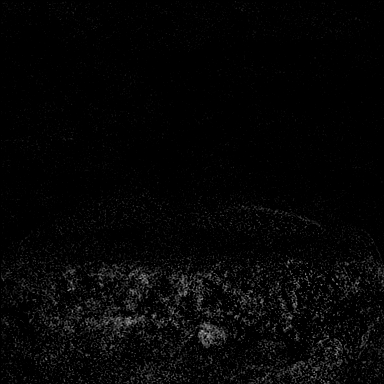
[im 32/128]
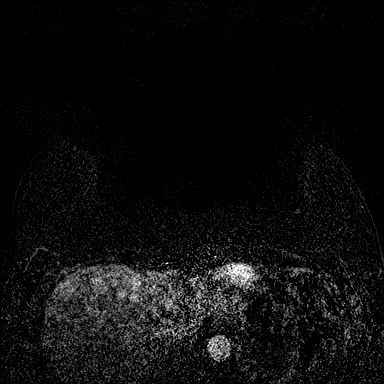
[im 64/128]
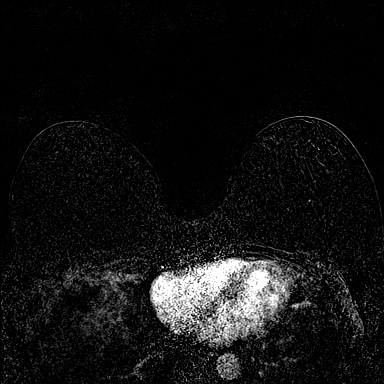
[im 96/128]
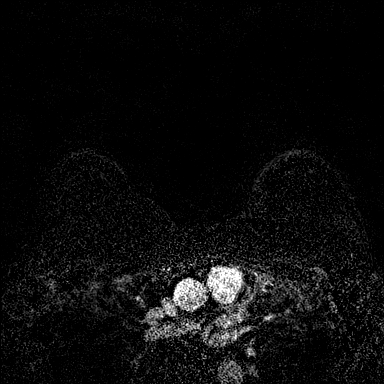
[im 128/128]
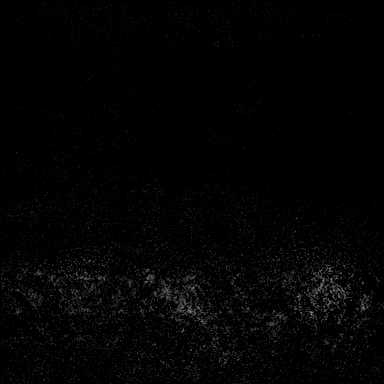

[Series 7: fl3d post-cm 20 · axial · 153.6mm · 0.89mm/px · 1 of 1 slices shown (3 of 3)]
[im 1/1]
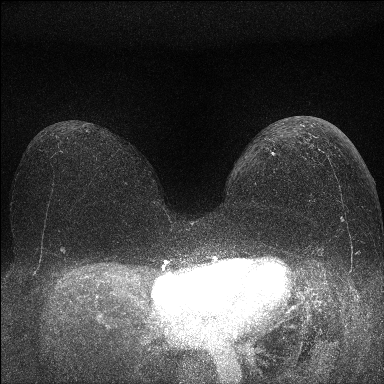

[Series 8: fl3d post-cm 3 · axial · 1.2mm · 0.89mm/px · z∈[-50,+102]mm · 6 of 128 slices shown (1 of 2)]
[im 1/128]
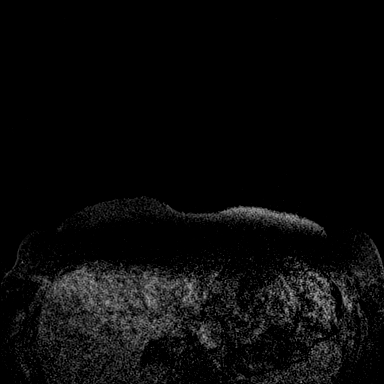
[im 26/128]
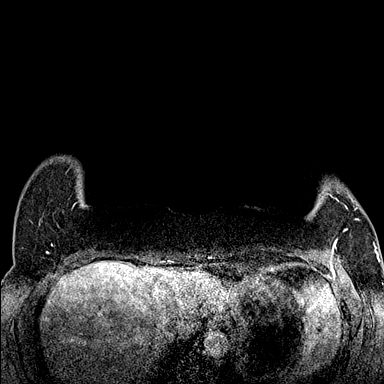
[im 51/128]
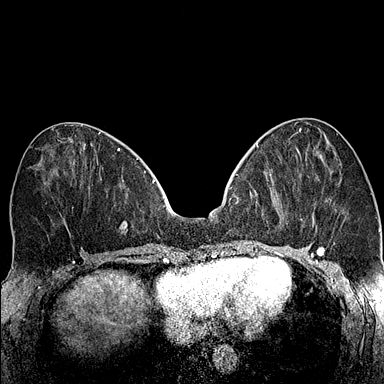
[im 77/128]
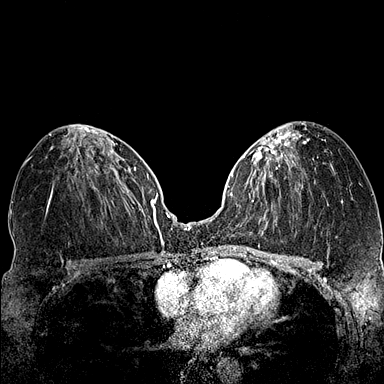
[im 102/128]
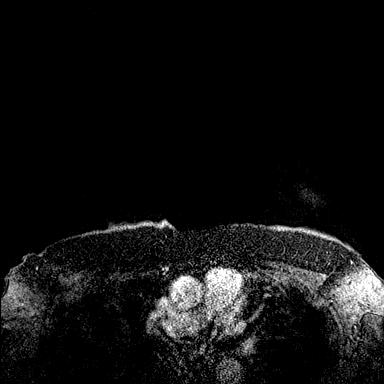
[im 128/128]
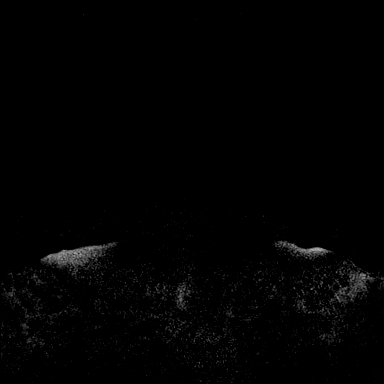

[Series 9: fl3d post-cm 3 · axial · 1.2mm · 0.89mm/px · z∈[-50,+71]mm · 5 of 128 slices shown (2 of 2)]
[im 1/128]
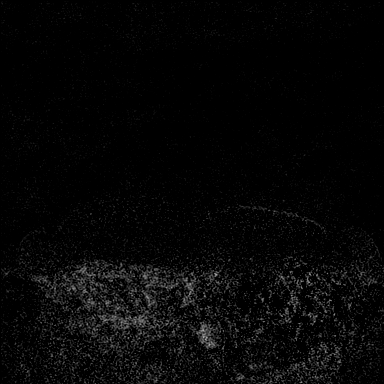
[im 26/128]
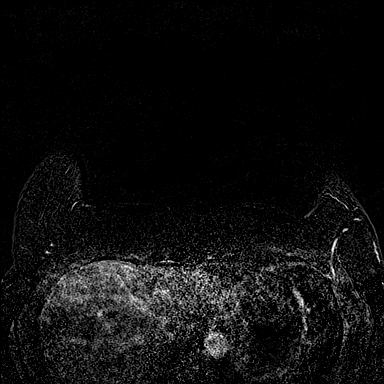
[im 51/128]
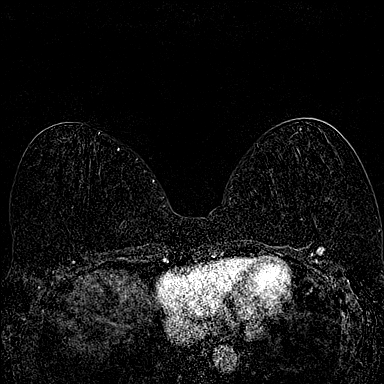
[im 77/128]
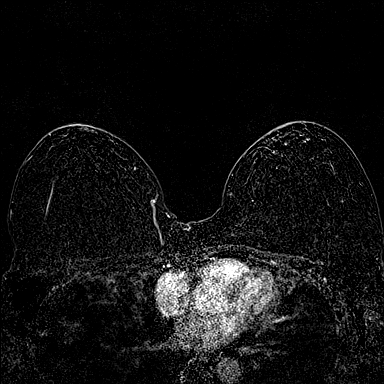
[im 102/128]
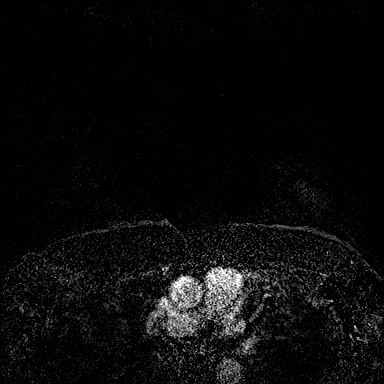

[33 of 48 positions shown; findings below may reference images not displayed]

Three-dimensional MR images were rendered by post-processing of the
original MR data on an independent workstation. The
three-dimensional MR images were interpreted, and findings are
reported in the following complete MRI report for this study. Three
dimensional images were evaluated at the independent interpreting
workstation using the DynaCAD thin client.
FINDINGS: Breast composition: c. Heterogeneous fibroglandular tissue.

Background parenchymal enhancement: Minimal.

Right breast: No suspicious mass or abnormal enhancement.

Left breast: No suspicious mass or abnormal enhancement.

Lymph nodes: No abnormal appearing lymph nodes.

Ancillary findings:  None.
IMPRESSION: No suspicious MRI findings in either breast.

RECOMMENDATION:
1. Clinical/surgical follow-up for the patient's left nipple
discharge.
2. Annual bilateral mammography, due this month.

BI-RADS CATEGORY  1: Negative.

## 2024-01-03 ENCOUNTER — Encounter: Payer: Self-pay | Admitting: Family Medicine

## 2024-01-03 DIAGNOSIS — Z1231 Encounter for screening mammogram for malignant neoplasm of breast: Secondary | ICD-10-CM

## 2024-01-07 ENCOUNTER — Other Ambulatory Visit: Payer: Self-pay | Admitting: Family Medicine

## 2024-01-07 DIAGNOSIS — Z1231 Encounter for screening mammogram for malignant neoplasm of breast: Secondary | ICD-10-CM

## 2024-02-04 ENCOUNTER — Ambulatory Visit
Admission: RE | Admit: 2024-02-04 | Discharge: 2024-02-04 | Disposition: A | Source: Ambulatory Visit | Attending: Family Medicine | Admitting: Family Medicine

## 2024-02-04 DIAGNOSIS — Z1231 Encounter for screening mammogram for malignant neoplasm of breast: Secondary | ICD-10-CM
